# Patient Record
Sex: Female | Born: 1990 | Hispanic: No | Marital: Married | Smoking: Never smoker
Health system: Southern US, Community
[De-identification: ages and names within clinical notes are randomized; demographics above are authoritative.]

## PROBLEM LIST (undated history)

## (undated) ENCOUNTER — Inpatient Hospital Stay (HOSPITAL_COMMUNITY): Payer: Self-pay

## (undated) DIAGNOSIS — N96 Recurrent pregnancy loss: Secondary | ICD-10-CM

## (undated) DIAGNOSIS — Z789 Other specified health status: Secondary | ICD-10-CM

## (undated) HISTORY — PX: APPENDECTOMY: SHX54

## (undated) HISTORY — DX: Recurrent pregnancy loss: N96

---

## 2015-07-02 ENCOUNTER — Emergency Department (HOSPITAL_BASED_OUTPATIENT_CLINIC_OR_DEPARTMENT_OTHER): Payer: Self-pay

## 2015-07-02 ENCOUNTER — Emergency Department (HOSPITAL_BASED_OUTPATIENT_CLINIC_OR_DEPARTMENT_OTHER)
Admission: EM | Admit: 2015-07-02 | Discharge: 2015-07-02 | Disposition: A | Discharge status: Home / Self Care | Payer: Self-pay | Attending: Emergency Medicine | Admitting: Emergency Medicine

## 2015-07-02 ENCOUNTER — Encounter (HOSPITAL_BASED_OUTPATIENT_CLINIC_OR_DEPARTMENT_OTHER): Payer: Self-pay | Admitting: *Deleted

## 2015-07-02 DIAGNOSIS — N939 Abnormal uterine and vaginal bleeding, unspecified: Secondary | ICD-10-CM

## 2015-07-02 DIAGNOSIS — Z3A01 Less than 8 weeks gestation of pregnancy: Secondary | ICD-10-CM | POA: Insufficient documentation

## 2015-07-02 DIAGNOSIS — O039 Complete or unspecified spontaneous abortion without complication: Secondary | ICD-10-CM

## 2015-07-02 DIAGNOSIS — O200 Threatened abortion: Secondary | ICD-10-CM | POA: Insufficient documentation

## 2015-07-02 LAB — HCG, QUANTITATIVE, PREGNANCY: HCG, BETA CHAIN, QUANT, S: 8 m[IU]/mL — AB (ref <5)

## 2015-07-02 MED ORDER — NAPROXEN 250 MG PO TABS
ORAL_TABLET | ORAL | Status: AC
  Filled 2015-07-02: qty 2

## 2015-07-02 MED ORDER — NAPROXEN 250 MG PO TABS: 500.0000 mg | ORAL_TABLET | Freq: Once | ORAL | Status: DC

## 2015-07-02 NOTE — ED Notes (Signed)
Pt reports that she recently found out that she was pregnant.  States that she started having lower abdominal pain and bleeding today.  Changing maxi pads 2-3x per hour.

## 2015-07-02 NOTE — ED Provider Notes (Signed)
CSN: [id number]     Arrival date & time 08/01/2015  0001 History   First MD Initiated Contact with Patient Aug 01, 2015 0211     Chief Complaint  Patient presents with  . Threatened Miscarriage     (Consider location/radiation/quality/duration/timing/severity/associated sxs/prior Treatment) HPI  This is a 24 year old female who recently had a positive pregnancy test on multiple occasions. She has had 2 days of pelvic cramping and vaginal bleeding that began yesterday. The bleeding has been about as heavy as a period. Cramping has been mild. She did pass one clot or tissue mass. There are no specific mitigating or exacerbating factors. She has not taken anything for the cramping.  History reviewed. No pertinent past medical history. History reviewed. No pertinent past surgical history. History reviewed. No pertinent family history. Social History  Substance Use Topics  . Smoking status: Never Smoker   . Smokeless tobacco: None  . Alcohol Use: No   OB History    No data available     Review of Systems  All other systems reviewed and are negative.   Allergies  Review of patient's allergies indicates no known allergies.  Home Medications   Prior to Admission medications   Not on File   BP 112/67 mmHg  Pulse 83  Temp(Src) 98 F (36.7 C) (Oral)  Resp 18  Ht 5' (1.524 m)  Wt 100 lb (45.36 kg)  BMI 19.53 kg/m2  SpO2 100%  LMP 05/25/2015   Physical Exam  General: Well-developed, well-nourished female in no acute distress; appearance consistent with age of record HENT: normocephalic; atraumatic Eyes: pupils equal, round and reactive to light; extraocular muscles intact Neck: supple Heart: regular rate and rhythm Lungs: clear to auscultation bilaterally Abdomen: soft; nondistended; suprapubic tenderness; no masses or hepatosplenomegaly; bowel sounds present GU: No CVA tenderness Extremities: No deformity; full range of motion; pulses normal Neurologic: Awake, alert and  oriented; motor function intact in all extremities and symmetric; no facial droop Skin: Warm and dry Psychiatric: Normal mood and affect    ED Course  Procedures (including critical care time)   MDM   Nursing notes and vitals signs, including pulse oximetry, reviewed.  Summary of this visit's results, reviewed by myself:  Labs:  Results for orders placed or performed during the hospital encounter of 08/01/15 (from the past 24 hour(s))  hCG, quantitative, pregnancy     Status: Abnormal   Collection Time: August 01, 2015 12:14 AM  Result Value Ref Range   hCG, Beta Chain, Quant, S 8 (H) <5 mIU/mL    Imaging Studies: US Ob Comp Less 14 Wks  08/01/2015  CLINICAL DATA:  Vaginal bleeding for several hours EXAM: OBSTETRIC <14 WK [place] AND TRANSVAGINAL OB US TECHNIQUE: Both transabdominal and transvaginal ultrasound examinations were performed for complete evaluation of the gestation as well as the maternal uterus, adnexal regions, and pelvic cul-de-sac. Transvaginal technique was performed to assess early pregnancy. COMPARISON:  None. FINDINGS: Intrauterine gestational sac: Not visualized Yolk sac:  Not visualized Embryo:  Not visualized Cardiac Activity: Not visualized Maternal uterus/adnexae: Uterus measures 8.9 x 5.0 x 5.3 cm. There is no intrauterine mass. Endometrium measures 17 mm in thickness with a smooth contour. Right ovary measures 2.7 x 2.2 x 2.3 cm. Left ovary measures 3.4 x 1.5 x 1.5 cm. There is no extrauterine pelvic or adnexal mass. No free pelvic fluid. IMPRESSION: No intrauterine mass or intrauterine gestation. No extrauterine pelvic mass or free fluid. Note that no intrauterine gestational sac or fetus is seen. If serum  pregnancy test is positive, differential considerations for this circumstance include recent spontaneous abortion, early intrauterine gestation too early to be seen by either transabdominal or transvaginal technique, or possible ectopic gestation. Close clinical and  laboratory evaluation in this regard advised. Timing of potential repeat ultrasound examination will in large part depend on beta HCG values. Electronically Signed   By: Lowella Grip III M.D.   On: 07/02/2015 00:51   US Ob Transvaginal  07/02/2015  CLINICAL DATA:  Vaginal bleeding for several hours EXAM: OBSTETRIC <14 WK [place] AND TRANSVAGINAL OB US TECHNIQUE: Both transabdominal and transvaginal ultrasound examinations were performed for complete evaluation of the gestation as well as the maternal uterus, adnexal regions, and pelvic cul-de-sac. Transvaginal technique was performed to assess early pregnancy. COMPARISON:  None. FINDINGS: Intrauterine gestational sac: Not visualized Yolk sac:  Not visualized Embryo:  Not visualized Cardiac Activity: Not visualized Maternal uterus/adnexae: Uterus measures 8.9 x 5.0 x 5.3 cm. There is no intrauterine mass. Endometrium measures 17 mm in thickness with a smooth contour. Right ovary measures 2.7 x 2.2 x 2.3 cm. Left ovary measures 3.4 x 1.5 x 1.5 cm. There is no extrauterine pelvic or adnexal mass. No free pelvic fluid. IMPRESSION: No intrauterine mass or intrauterine gestation. No extrauterine pelvic mass or free fluid. Note that no intrauterine gestational sac or fetus is seen. If serum pregnancy test is positive, differential considerations for this circumstance include recent spontaneous abortion, early intrauterine gestation too early to be seen by either transabdominal or transvaginal technique, or possible ectopic gestation. Close clinical and laboratory evaluation in this regard advised. Timing of potential repeat ultrasound examination will in large part depend on beta HCG values. Electronically Signed   By: Lowella Grip III M.D.   On: 07/02/2015 00:51   Findings are consistent with miscarriage. We will have her follow-up in 48 hours at [facility] for recheck.    Shanon Rosser, MD 07/02/15 [id number]

## 2015-07-02 NOTE — ED Notes (Signed)
Patient transported to Ultrasound 

## 2015-08-15 ENCOUNTER — Inpatient Hospital Stay (HOSPITAL_COMMUNITY): Payer: Self-pay

## 2015-08-15 ENCOUNTER — Inpatient Hospital Stay (HOSPITAL_COMMUNITY)
Admission: AD | Admit: 2015-08-15 | Discharge: 2015-08-15 | Disposition: A | Discharge status: Home / Self Care | Payer: Self-pay | Source: Ambulatory Visit | Attending: Family Medicine | Admitting: Family Medicine

## 2015-08-15 ENCOUNTER — Ambulatory Visit (INDEPENDENT_AMBULATORY_CARE_PROVIDER_SITE_OTHER): Payer: Self-pay | Admitting: Student

## 2015-08-15 ENCOUNTER — Encounter (HOSPITAL_COMMUNITY): Payer: Self-pay | Admitting: *Deleted

## 2015-08-15 VITALS — BP 120/54 | HR 74 | Temp 98.1°F | Resp 18 | Wt 103.5 lb

## 2015-08-15 DIAGNOSIS — O26899 Other specified pregnancy related conditions, unspecified trimester: Secondary | ICD-10-CM

## 2015-08-15 DIAGNOSIS — O9989 Other specified diseases and conditions complicating pregnancy, childbirth and the puerperium: Secondary | ICD-10-CM

## 2015-08-15 DIAGNOSIS — Z3A01 Less than 8 weeks gestation of pregnancy: Secondary | ICD-10-CM | POA: Insufficient documentation

## 2015-08-15 DIAGNOSIS — O208 Other hemorrhage in early pregnancy: Secondary | ICD-10-CM | POA: Insufficient documentation

## 2015-08-15 DIAGNOSIS — O3680X0 Pregnancy with inconclusive fetal viability, not applicable or unspecified: Secondary | ICD-10-CM

## 2015-08-15 DIAGNOSIS — R109 Unspecified abdominal pain: Secondary | ICD-10-CM

## 2015-08-15 LAB — POCT URINALYSIS DIP (DEVICE)
Bilirubin Urine: NEGATIVE
GLUCOSE, UA: NEGATIVE mg/dL
HGB URINE DIPSTICK: NEGATIVE
Ketones, ur: NEGATIVE mg/dL
LEUKOCYTES UA: NEGATIVE
NITRITE: NEGATIVE
PROTEIN: NEGATIVE mg/dL
Specific Gravity, Urine: 1.025 (ref 1.005–1.030)
UROBILINOGEN UA: 0.2 mg/dL (ref 0.0–1.0)
pH: 5.5 (ref 5.0–8.0)

## 2015-08-15 LAB — CBC
HCT: 35 % — ABNORMAL LOW (ref 36.0–46.0)
Hemoglobin: 11.7 g/dL — ABNORMAL LOW (ref 12.0–15.0)
MCH: 26.6 pg (ref 26.0–34.0)
MCHC: 33.4 g/dL (ref 30.0–36.0)
MCV: 79.5 fL (ref 78.0–100.0)
Platelets: 233 10*3/uL (ref 150–400)
RBC: 4.4 MIL/uL (ref 3.87–5.11)
RDW: 14.3 % (ref 11.5–15.5)
WBC: 10.5 10*3/uL (ref 4.0–10.5)

## 2015-08-15 LAB — HCG, QUANTITATIVE, PREGNANCY: hCG, Beta Chain, Quant, S: 62741 m[IU]/mL — ABNORMAL HIGH (ref <5)

## 2015-08-15 LAB — ABO/RH: ABO/RH(D): O POS

## 2015-08-15 LAB — POCT PREGNANCY, URINE: Preg Test, Ur: POSITIVE — AB

## 2015-08-15 NOTE — Progress Notes (Signed)
   Subjective:    Patient ID: Mary Hays, female    DOB: 17-Apr-1991, 24 y.o.   MRN: [id number]  HPI Mary Hays is a 24 y.o. female who presents for miscarriage follow up. Was seen at ED the end of October for vaginal bleeding in pregnancy. Had previously had positive urine pregnancy tests. During that visit her BHCG was 8. Scheduled here today for follow up.  Has had unprotected intercourse since then. Positive pregnancy test in mid November x 3.  Denies vaginal bleeding but does report daily lower abdominal pain x 2 weeks that has been worse today.    Review of Systems  Constitutional: Negative.   Genitourinary: Positive for pelvic pain. Negative for dysuria and vaginal bleeding.       Objective:   Physical Exam  Constitutional: She appears well-developed and well-nourished. No distress.  HENT:  Head: Normocephalic and atraumatic.  Pulmonary/Chest: Effort normal. No respiratory distress.  Musculoskeletal: Normal range of motion.  Skin: She is not diaphoretic.  Psychiatric: She has a normal mood and affect. Her behavior is normal. Judgment and thought content normal.    BP 120/54 mmHg  Pulse 74  Temp(Src) 98.1 F (36.7 C) (Oral)  Resp 18  Wt 103 lb 8 oz (46.947 kg)  SpO2 100%  LMP  (LMP Unknown)         Assessment & Plan:   1. Abdominal pain in pregnancy    Positive UPT today in clinic.  Will send to MAU for evaluations d/t abdominal pain.   Jorje Guild, NP

## 2015-08-15 NOTE — Patient Instructions (Signed)

## 2015-08-15 NOTE — MAU Note (Signed)
Pt presents to MAU from clinic to determine how far pregnant she is. Pt had a quant a couple of months ago that was 8 and had a positive pregnancy test today in the clinic. Had lower abdominal pain this morning.

## 2015-08-15 NOTE — Discharge Instructions (Signed)

## 2015-08-15 NOTE — MAU Provider Note (Signed)
History     CSN: [id number]  Arrival date and time: 08/15/15 1604   First Provider Initiated Contact with Patient 08/15/15 1635      No chief complaint on file.  HPI  Mary Hays 24 y.o. [phone] [redacted]w[redacted]d presents from the clinic after having a follow up appointment for miscarriage. She had a positive urine pregnancy test today in the clinic. She has had some lower abdominal cramping, no vaginal bleeding  History reviewed. No pertinent past medical history.  Past Surgical History  Procedure Laterality Date  . Appendectomy      History reviewed. No pertinent family history.  Social History  Substance Use Topics  . Smoking status: Never Smoker   . Smokeless tobacco: None  . Alcohol Use: No    Allergies: No Known Allergies  No prescriptions prior to admission    Review of Systems  Constitutional: Negative for fever.  Gastrointestinal: Positive for abdominal pain.  All other systems reviewed and are negative.  Physical Exam   Blood pressure 130/61, pulse 69, temperature 98.1 F (36.7 C), resp. rate 18, last menstrual period 07/19/2015.  Physical Exam  Nursing note and vitals reviewed. Constitutional: She is oriented to person, place, and time. She appears well-developed and well-nourished. No distress.  HENT:  Head: Normocephalic and atraumatic.  Cardiovascular: Normal rate and regular rhythm.   Respiratory: Effort normal and breath sounds normal. No respiratory distress.  GI: Soft. There is no tenderness.  Musculoskeletal: Normal range of motion.  Neurological: She is alert and oriented to person, place, and time.  Skin: Skin is warm and dry.  Psychiatric: She has a normal mood and affect. Her behavior is normal. Judgment and thought content normal.   US Ob Transvaginal  08/15/2015  CLINICAL DATA:  Pelvic pain during early pregnancy. Approximately [redacted] weeks pregnant by dates. EXAM: TRANSVAGINAL OB ULTRASOUND TECHNIQUE: Transvaginal ultrasound was performed for  complete evaluation of the gestation as well as the maternal uterus, adnexal regions, and pelvic cul-de-sac. COMPARISON:  None. FINDINGS: Intrauterine gestational sac: Single intrauterine gestation identified. Yolk sac:  Present. Embryo:  Present. Cardiac Activity: Regular cardiac activity present and detected with a rate of 1 await beats per minute. Heart Rate: 108 bpm MSD:   mm    w     d CRL:   4.1  mm   6 w 1 d                  [place] EDC: 04/08/2016 Maternal uterus/adnexae: Moderate-sized subchorionic hemorrhage seen adjacent to the gestational sac. The right ovary contains the corpus luteum. No free fluid is identified in the pelvis. No evidence of adnexal masses. IMPRESSION: Single viable intrauterine gestation with estimated gestational age of [redacted] weeks and 1 day by ultrasound. Moderate subchorionic hemorrhage identified. Electronically Signed   By: Aletta Edouard M.D.   On: 08/15/2015 17:29   Results for orders placed or performed during the hospital encounter of 08/15/15 (from the past 24 hour(s))  CBC     Status: Abnormal   Collection Time: 08/15/15  4:47 PM  Result Value Ref Range   WBC 10.5 4.0 - 10.5 K/uL   RBC 4.40 3.87 - 5.11 MIL/uL   Hemoglobin 11.7 (L) 12.0 - 15.0 g/dL   HCT 35.0 (L) 36.0 - 46.0 %   MCV 79.5 78.0 - 100.0 fL   MCH 26.6 26.0 - 34.0 pg   MCHC 33.4 30.0 - 36.0 g/dL   RDW 14.3 11.5 - 15.5 %   Platelets 233  150 - 400 K/uL  ABO/Rh     Status: None (Preliminary result)   Collection Time: 08/15/15  4:47 PM  Result Value Ref Range   ABO/RH(D) O POS     MAU Course  Procedures  MDM [place]- Confirmed viable IUP; Follow up with routine Prenatal care EDD- 04/08/16 Moderate Subchorionic Hemorrhage   Assessment and Plan  Pregnancy of unknown location Abdominal Pain in Pregnancy  Start Prenatal Care Discharge   Clemmons,Lori Grissett 08/15/2015, 4:46 PM

## 2015-09-04 NOTE — L&D Delivery Note (Signed)
25 y.o. G3P1011 at [redacted]w[redacted]d delivered a viable female infant in cephalic, LOA position. No nuchal cord. Right anterior shoulder delivered with ease. 50 sec delayed cord clamping. Cord clamped x2 and cut. Placenta delivered spontaneously intact, with 3VC. Fundus firm on exam with massage and pitocin. Good hemostasis noted.  Laceration: first degree Suture: 3-0 Vicryl Good hemostasis noted.  Mom and baby recovering in LDR.    Apgars: 4/7 Cord Gas: pH 7.2 Weight: pending    Katherine Basset, DO [facility] 04/14/2016, 6:41 PM

## 2015-10-19 ENCOUNTER — Encounter: Payer: Self-pay | Admitting: Obstetrics and Gynecology

## 2015-10-19 ENCOUNTER — Ambulatory Visit (INDEPENDENT_AMBULATORY_CARE_PROVIDER_SITE_OTHER): Payer: Self-pay | Admitting: Obstetrics and Gynecology

## 2015-10-19 VITALS — BP 118/63 | HR 102 | Wt 107.6 lb

## 2015-10-19 DIAGNOSIS — Z113 Encounter for screening for infections with a predominantly sexual mode of transmission: Secondary | ICD-10-CM

## 2015-10-19 DIAGNOSIS — Z124 Encounter for screening for malignant neoplasm of cervix: Secondary | ICD-10-CM

## 2015-10-19 DIAGNOSIS — Z3493 Encounter for supervision of normal pregnancy, unspecified, third trimester: Secondary | ICD-10-CM | POA: Insufficient documentation

## 2015-10-19 DIAGNOSIS — Z3492 Encounter for supervision of normal pregnancy, unspecified, second trimester: Secondary | ICD-10-CM

## 2015-10-19 DIAGNOSIS — Z3482 Encounter for supervision of other normal pregnancy, second trimester: Secondary | ICD-10-CM

## 2015-10-19 LAB — POCT URINALYSIS DIP (DEVICE)
BILIRUBIN URINE: NEGATIVE
Glucose, UA: NEGATIVE mg/dL
HGB URINE DIPSTICK: NEGATIVE
KETONES UR: NEGATIVE mg/dL
LEUKOCYTES UA: NEGATIVE
NITRITE: NEGATIVE
Protein, ur: NEGATIVE mg/dL
Specific Gravity, Urine: 1.025 (ref 1.005–1.030)
Urobilinogen, UA: 0.2 mg/dL (ref 0.0–1.0)
pH: 6.5 (ref 5.0–8.0)

## 2015-10-19 NOTE — Progress Notes (Signed)
   Subjective:    Mary Hays is a G3P1011 [redacted]w[redacted]d being seen today for her first obstetrical visit.  Her obstetrical history is significant for uncomplicated NSVD at [facility]. Marland Kitchen Patient does intend to breast feed. Pregnancy history fully reviewed.  Patient reports no complaints.  Filed Vitals:   10/19/15 1348  BP: 118/63  Pulse: 102  Weight: 107 lb 9.6 oz (48.807 kg)    HISTORY: OB History  Gravida Para Term Preterm AB SAB TAB Ectopic Multiple Living  3 1 1  1 1    1     # Outcome Date GA Lbr Len/2nd Weight Sex Delivery Anes PTL Lv  3 Current           2 SAB 07/01/15          1 Term 03/13/13    M Vag-Spont EPI  Y     History reviewed. No pertinent past medical history. Past Surgical History  Procedure Laterality Date  . Appendectomy     Family History  Problem Relation Age of Onset  . Diabetes Father      Exam    Uterus:   S=D. DT FHR 158  Pelvic Exam: Very tense with exam   Perineum: Hemorrhoids, Normal Perineum (small external tag)   Vulva: normal, Bartholin's, Urethra, Skene's normal   Vagina:  normal mucosa, normal discharge       Cervix: no bleeding following Pap and no lesions   Adnexa: not evaluated   Bony Pelvis: average  System: Breast:  normal appearance, no masses or tenderness, Inspection negative   Skin: normal coloration and turgor, no rashes    Neurologic: oriented, normal, grossly non-focal   Extremities: normal strength, tone, and muscle mass   HEENT PERRLA   Mouth/Teeth mucous membranes moist, pharynx normal without lesions and dental hygiene good   Neck supple and no masses. Thyroid nonpalpable   Cardiovascular: regular rate and rhythm, no murmurs or gallops   Respiratory:  appears well, vitals normal, no respiratory distress, acyanotic, normal RR, ear and throat exam is normal, neck free of mass or lymphadenopathy, chest clear, no wheezing, crepitations, rhonchi, normal symmetric air entry   Abdomen: soft, non-tender; bowel sounds normal; no  masses,  no organomegaly   Urinary: urethral meatus normal      Assessment:    Pregnancy: [id number] Patient Active Problem List   Diagnosis Date Noted  . Supervision of normal pregnancy in second trimester 10/19/2015        Plan:     Initial labs drawn. 1 hr GCT due to 1st degree relative with DM Prenatal vitamins. Problem list reviewed and updated. Genetic Screening discussed Quad Screen: declined.  Ultrasound discussed; fetal survey: requested.  Follow up in 4 weeks. 50% of 30 min visit spent on counseling and coordination of care.  Anatomic [place] scheduled   Jodi Kappes 10/19/2015

## 2015-10-20 LAB — PRENATAL PROFILE (SOLSTAS)
Antibody Screen: NEGATIVE
BASOS ABS: 0 10*3/uL (ref 0.0–0.1)
BASOS PCT: 0 % (ref 0–1)
Eosinophils Absolute: 0.1 10*3/uL (ref 0.0–0.7)
Eosinophils Relative: 1 % (ref 0–5)
HEMATOCRIT: 33.4 % — AB (ref 36.0–46.0)
HEMOGLOBIN: 11.1 g/dL — AB (ref 12.0–15.0)
HEP B S AG: NEGATIVE
HIV: NONREACTIVE
LYMPHS PCT: 20 % (ref 12–46)
Lymphs Abs: 2 10*3/uL (ref 0.7–4.0)
MCH: 27.5 pg (ref 26.0–34.0)
MCHC: 33.2 g/dL (ref 30.0–36.0)
MCV: 82.7 fL (ref 78.0–100.0)
MPV: 10.6 fL (ref 8.6–12.4)
Monocytes Absolute: 0.6 10*3/uL (ref 0.1–1.0)
Monocytes Relative: 6 % (ref 3–12)
NEUTROS ABS: 7.4 10*3/uL (ref 1.7–7.7)
NEUTROS PCT: 73 % (ref 43–77)
Platelets: 195 10*3/uL (ref 150–400)
RBC: 4.04 MIL/uL (ref 3.87–5.11)
RDW: 14.8 % (ref 11.5–15.5)
RH TYPE: POSITIVE
Rubella: 4.31 Index — ABNORMAL HIGH (ref <0.90)
WBC: 10.2 10*3/uL (ref 4.0–10.5)

## 2015-10-20 LAB — CULTURE, OB URINE
COLONY COUNT: NO GROWTH
ORGANISM ID, BACTERIA: NO GROWTH

## 2015-10-20 LAB — GLUCOSE TOLERANCE, 1 HOUR (50G) W/O FASTING: GLUCOSE 1 HOUR GTT: 107 mg/dL (ref 70–140)

## 2015-10-20 LAB — GC/CHLAMYDIA PROBE AMP (~~LOC~~) NOT AT ARMC
Chlamydia: NEGATIVE
Neisseria Gonorrhea: NEGATIVE

## 2015-10-20 LAB — CYTOLOGY - PAP

## 2015-10-21 LAB — PRESCRIPTION MONITORING PROFILE (19 PANEL)
Amphetamine/Meth: NEGATIVE ng/mL
BARBITURATE SCREEN, URINE: NEGATIVE ng/mL
Benzodiazepine Screen, Urine: NEGATIVE ng/mL
Buprenorphine, Urine: NEGATIVE ng/mL
CANNABINOID SCRN UR: NEGATIVE ng/mL
CARISOPRODOL, URINE: NEGATIVE ng/mL
COCAINE METABOLITES: NEGATIVE ng/mL
CREATININE, URINE: 152.53 mg/dL (ref >20.0)
ECSTASY: NEGATIVE ng/mL
FENTANYL URINE: NEGATIVE ng/mL
MEPERIDINE UR: NEGATIVE ng/mL
METHADONE SCREEN, URINE: NEGATIVE ng/mL
Methaqualone: NEGATIVE ng/mL
Nitrites, Initial: NEGATIVE ug/mL
OPIATE SCREEN, URINE: NEGATIVE ng/mL
OXYCODONE SCRN UR: NEGATIVE ng/mL
PH URINE, INITIAL: 6.7 pH (ref 4.5–8.9)
PHENCYCLIDINE, UR: NEGATIVE ng/mL
Propoxyphene: NEGATIVE ng/mL
Tapentadol, urine: NEGATIVE ng/mL
Tramadol Scrn, Ur: NEGATIVE ng/mL
Zolpidem, Urine: NEGATIVE ng/mL

## 2015-10-21 LAB — HEMOGLOBINOPATHY EVALUATION
HEMOGLOBIN OTHER: 0 %
HGB F QUANT: 0.4 % (ref 0.0–2.0)
Hgb A2 Quant: 2.9 % (ref 2.2–3.2)
Hgb A: 96.7 % — ABNORMAL LOW (ref 96.8–97.8)
Hgb S Quant: 0 %

## 2015-11-09 ENCOUNTER — Other Ambulatory Visit: Payer: Self-pay | Admitting: Obstetrics and Gynecology

## 2015-11-09 ENCOUNTER — Ambulatory Visit (HOSPITAL_COMMUNITY)
Admission: RE | Admit: 2015-11-09 | Discharge: 2015-11-09 | Disposition: A | Discharge status: Home / Self Care | Payer: Self-pay | Source: Ambulatory Visit | Attending: Obstetrics and Gynecology | Admitting: Obstetrics and Gynecology

## 2015-11-09 DIAGNOSIS — Z3A18 18 weeks gestation of pregnancy: Secondary | ICD-10-CM | POA: Insufficient documentation

## 2015-11-09 DIAGNOSIS — Z36 Encounter for antenatal screening of mother: Secondary | ICD-10-CM | POA: Insufficient documentation

## 2015-11-09 DIAGNOSIS — Z3689 Encounter for other specified antenatal screening: Secondary | ICD-10-CM

## 2015-11-09 DIAGNOSIS — Z3492 Encounter for supervision of normal pregnancy, unspecified, second trimester: Secondary | ICD-10-CM

## 2015-11-16 ENCOUNTER — Ambulatory Visit (INDEPENDENT_AMBULATORY_CARE_PROVIDER_SITE_OTHER): Payer: Self-pay | Admitting: Family

## 2015-11-16 VITALS — BP 113/58 | HR 78 | Temp 98.3°F | Wt 111.9 lb

## 2015-11-16 DIAGNOSIS — Z3492 Encounter for supervision of normal pregnancy, unspecified, second trimester: Secondary | ICD-10-CM

## 2015-11-16 DIAGNOSIS — Z3482 Encounter for supervision of other normal pregnancy, second trimester: Secondary | ICD-10-CM

## 2015-11-16 LAB — POCT URINALYSIS DIP (DEVICE)
Bilirubin Urine: NEGATIVE
GLUCOSE, UA: NEGATIVE mg/dL
Hgb urine dipstick: NEGATIVE
Ketones, ur: NEGATIVE mg/dL
NITRITE: NEGATIVE
PROTEIN: NEGATIVE mg/dL
Specific Gravity, Urine: 1.025 (ref 1.005–1.030)
UROBILINOGEN UA: 0.2 mg/dL (ref 0.0–1.0)
pH: 6 (ref 5.0–8.0)

## 2015-11-16 NOTE — Progress Notes (Signed)
Breastfeeding tip of the week reviewed. 

## 2015-11-16 NOTE — Progress Notes (Signed)
Subjective:  Mary Hays is a 25 y.o. G3P1011 at [redacted]w[redacted]d being seen today for ongoing prenatal care.  She is currently monitored for the following issues for this low-risk pregnancy and has Supervision of normal pregnancy in second trimester on her problem list.  Patient reports no complaints.  Contractions: Not present. Vag. Bleeding: None.  Movement: Present. Denies leaking of fluid.   The following portions of the patient's history were reviewed and updated as appropriate: allergies, current medications, past family history, past medical history, past social history, past surgical history and problem list. Problem list updated.  Objective:   Filed Vitals:   11/16/15 1202  BP: 113/58  Pulse: 78  Temp: 98.3 F (36.8 C)  Weight: 111 lb 14.4 oz (50.758 kg)    Fetal Status: Fetal Heart Rate (bpm): 146 Fundal Height: 19 cm Movement: Present     General:  Alert, oriented and cooperative. Patient is in no acute distress.  Skin: Skin is warm and dry. No rash noted.   Cardiovascular: Normal heart rate noted  Respiratory: Normal respiratory effort, no problems with respiration noted  Abdomen: Soft, gravid, appropriate for gestational age. Pain/Pressure: Absent     Pelvic: Vag. Bleeding: None     Cervical exam deferred        Extremities: Normal range of motion.  Edema: None  Mental Status: Normal mood and affect. Normal behavior. Normal judgment and thought content.   Urinalysis: Urine Protein: Negative Urine Glucose: Negative  Assessment and Plan:  Pregnancy: G3P1011 at [redacted]w[redacted]d  1. Supervision of normal pregnancy in second trimester - Reviewed ultrasound results  General obstetric precautions including but not limited to vaginal bleeding and pelvic pain reviewed in detail with the patient. Please refer to After Visit Summary for other counseling recommendations.  Return in about 4 weeks (around 12/14/2015).   Venia Carbon Michiel Cowboy, CNM

## 2015-11-22 NOTE — Addendum Note (Signed)
Encounter addended by: Lorene Dy, CNM on: 11/22/2015  5:13 PM<BR>     Documentation filed: Problem List, Message

## 2015-12-14 ENCOUNTER — Encounter: Payer: Self-pay | Admitting: Advanced Practice Midwife

## 2016-02-29 ENCOUNTER — Ambulatory Visit (INDEPENDENT_AMBULATORY_CARE_PROVIDER_SITE_OTHER): Payer: Self-pay | Admitting: Student

## 2016-02-29 VITALS — BP 115/69 | HR 72 | Wt 134.9 lb

## 2016-02-29 DIAGNOSIS — Z3483 Encounter for supervision of other normal pregnancy, third trimester: Secondary | ICD-10-CM

## 2016-02-29 DIAGNOSIS — Z3492 Encounter for supervision of normal pregnancy, unspecified, second trimester: Secondary | ICD-10-CM

## 2016-02-29 LAB — POCT URINALYSIS DIP (DEVICE)
Bilirubin Urine: NEGATIVE
Glucose, UA: NEGATIVE mg/dL
HGB URINE DIPSTICK: NEGATIVE
KETONES UR: NEGATIVE mg/dL
Nitrite: NEGATIVE
PH: 6 (ref 5.0–8.0)
PROTEIN: 30 mg/dL — AB
SPECIFIC GRAVITY, URINE: 1.025 (ref 1.005–1.030)
UROBILINOGEN UA: 0.2 mg/dL (ref 0.0–1.0)

## 2016-02-29 NOTE — Patient Instructions (Signed)

## 2016-02-29 NOTE — Progress Notes (Signed)
Subjective:  Mary Hays is a 25 y.o. G3P1011 at [redacted]w[redacted]d being seen today for ongoing prenatal care.  She is currently monitored for the following issues for this low-risk pregnancy and has Supervision of normal pregnancy in second trimester on her problem list.  Patient reports no complaints.  Contractions: Not present. Vag. Bleeding: None.  Movement: Present. Denies leaking of fluid. Pt missed last appointment & hasn't been seen since March. States she missed appts d/t death in the family (her nephew). Can't stay today for glucola but will schedule for lab only visit.  Reports some lower abdominal pressure x 2 weeks. No contractions, LOF, or vaginal bleeding.   The following portions of the patient's history were reviewed and updated as appropriate: allergies, current medications, past family history, past medical history, past social history, past surgical history and problem list. Problem list updated.  Objective:   Filed Vitals:   02/29/16 0924  BP: 115/69  Pulse: 72  Weight: 134 lb 14.4 oz (61.19 kg)    Fetal Status: Fetal Heart Rate (bpm): 136   Movement: Present     General:  Alert, oriented and cooperative. Patient is in no acute distress.  Skin: Skin is warm and dry. No rash noted.   Cardiovascular: Normal heart rate noted  Respiratory: Normal respiratory effort, no problems with respiration noted  Abdomen: Soft, gravid, appropriate for gestational age. Pain/Pressure: Present    Fundal height 33 cm  Pelvic: Cervical exam deferred        Extremities: Normal range of motion.     Mental Status: Normal mood and affect. Normal behavior. Normal judgment and thought content.   Urinalysis:      Assessment and Plan:  Pregnancy: G3P1011 at [redacted]w[redacted]d  1. Supervision of normal pregnancy in second trimester -Pt will schedule lab only visit for 28 wks labs -recommended maternity support belt for pressure  Preterm labor symptoms and general obstetric precautions including but not limited  to vaginal bleeding, contractions, leaking of fluid and fetal movement were reviewed in detail with the patient. Please refer to After Visit Summary for other counseling recommendations.  Return in about 2 weeks (around 03/14/2016) for Routine OB.   Jorje Guild, NP

## 2016-03-08 ENCOUNTER — Other Ambulatory Visit: Payer: Self-pay

## 2016-03-08 DIAGNOSIS — Z3492 Encounter for supervision of normal pregnancy, unspecified, second trimester: Secondary | ICD-10-CM

## 2016-03-08 LAB — CBC
HEMATOCRIT: 27.3 % — AB (ref 35.0–45.0)
Hemoglobin: 8.5 g/dL — ABNORMAL LOW (ref 11.7–15.5)
MCH: 21.9 pg — AB (ref 27.0–33.0)
MCHC: 31.1 g/dL — AB (ref 32.0–36.0)
MCV: 70.4 fL — AB (ref 80.0–100.0)
MPV: 10.7 fL (ref 7.5–12.5)
PLATELETS: 221 10*3/uL (ref 140–400)
RBC: 3.88 MIL/uL (ref 3.80–5.10)
RDW: 15.9 % — AB (ref 11.0–15.0)
WBC: 10.4 10*3/uL (ref 3.8–10.8)

## 2016-03-09 ENCOUNTER — Other Ambulatory Visit: Payer: Self-pay | Admitting: Student

## 2016-03-09 DIAGNOSIS — O99013 Anemia complicating pregnancy, third trimester: Secondary | ICD-10-CM

## 2016-03-09 LAB — GLUCOSE TOLERANCE, 1 HOUR (50G) W/O FASTING: GLUCOSE, 1 HR, GESTATIONAL: 119 mg/dL (ref <140)

## 2016-03-09 LAB — HIV ANTIBODY (ROUTINE TESTING W REFLEX): HIV: NONREACTIVE

## 2016-03-09 LAB — RPR

## 2016-03-09 MED ORDER — FERROUS SULFATE 325 (65 FE) MG PO TABS: 325.0000 mg | ORAL_TABLET | Freq: Two times a day (BID) | ORAL | Status: DC

## 2016-03-13 ENCOUNTER — Telehealth: Payer: Self-pay | Admitting: General Practice

## 2016-03-13 NOTE — Telephone Encounter (Signed)
Pt informed of results.

## 2016-03-13 NOTE — Telephone Encounter (Signed)
Per Jorje Guild, patient's iron level is low and iron Rx has been sent to her pharmacy. Called patient, no answer- left message stating we are trying to reach you with non urgent results, please call us back

## 2016-03-22 ENCOUNTER — Ambulatory Visit (INDEPENDENT_AMBULATORY_CARE_PROVIDER_SITE_OTHER): Payer: Self-pay | Admitting: Student

## 2016-03-22 VITALS — BP 116/79 | HR 84 | Wt 139.0 lb

## 2016-03-22 DIAGNOSIS — Z3493 Encounter for supervision of normal pregnancy, unspecified, third trimester: Secondary | ICD-10-CM

## 2016-03-22 DIAGNOSIS — Z3483 Encounter for supervision of other normal pregnancy, third trimester: Secondary | ICD-10-CM

## 2016-03-22 DIAGNOSIS — Z113 Encounter for screening for infections with a predominantly sexual mode of transmission: Secondary | ICD-10-CM

## 2016-03-22 LAB — POCT URINALYSIS DIP (DEVICE)
BILIRUBIN URINE: NEGATIVE
Glucose, UA: NEGATIVE mg/dL
KETONES UR: NEGATIVE mg/dL
Nitrite: NEGATIVE
PH: 6.5 (ref 5.0–8.0)
Protein, ur: NEGATIVE mg/dL
Specific Gravity, Urine: 1.02 (ref 1.005–1.030)
Urobilinogen, UA: 0.2 mg/dL (ref 0.0–1.0)

## 2016-03-22 NOTE — Patient Instructions (Signed)
Braxton Hicks Contractions °Contractions of the uterus can occur throughout pregnancy. Contractions are not always a sign that you are in labor.  °WHAT ARE BRAXTON HICKS CONTRACTIONS?  °Contractions that occur before labor are called Braxton Hicks contractions, or false labor. Toward the end of pregnancy (32-34 weeks), these contractions can develop more often and may become more forceful. This is not true labor because these contractions do not result in opening (dilatation) and thinning of the cervix. They are sometimes difficult to tell apart from true labor because these contractions can be forceful and people have different pain tolerances. You should not feel embarrassed if you go to the hospital with false labor. Sometimes, the only way to tell if you are in true labor is for your health care provider to look for changes in the cervix. °If there are no prenatal problems or other health problems associated with the pregnancy, it is completely safe to be sent home with false labor and await the onset of true labor. °HOW CAN YOU TELL THE DIFFERENCE BETWEEN TRUE AND FALSE LABOR? °False Labor °· The contractions of false labor are usually shorter and not as hard as those of true labor.   °· The contractions are usually irregular.   °· The contractions are often felt in the front of the lower abdomen and in the groin.   °· The contractions may go away when you walk around or change positions while lying down.   °· The contractions get weaker and are shorter lasting as time goes on.   °· The contractions do not usually become progressively stronger, regular, and closer together as with true labor.   °True Labor °· Contractions in true labor last 30-70 seconds, become very regular, usually become more intense, and increase in frequency.   °· The contractions do not go away with walking.   °· The discomfort is usually felt in the top of the uterus and spreads to the lower abdomen and low back.   °· True labor can be  determined by your health care provider with an exam. This will show that the cervix is dilating and getting thinner.   °WHAT TO REMEMBER °· Keep up with your usual exercises and follow other instructions given by your health care provider.   °· Take medicines as directed by your health care provider.   °· Keep your regular prenatal appointments.   °· Eat and drink lightly if you think you are going into labor.   °· If Braxton Hicks contractions are making you uncomfortable:   °¨ Change your position from lying down or resting to walking, or from walking to resting.   °¨ Sit and rest in a tub of warm water.   °¨ Drink 2-3 glasses of water. Dehydration may cause these contractions.   °¨ Do slow and deep breathing several times an hour.   °WHEN SHOULD I SEEK IMMEDIATE MEDICAL CARE? °Seek immediate medical care if: °· Your contractions become stronger, more regular, and closer together.   °· You have fluid leaking or gushing from your vagina.   °· You have a fever.   °· You pass blood-tinged mucus.   °· You have vaginal bleeding.   °· You have continuous abdominal pain.   °· You have low back pain that you never had before.   °· You feel your baby's head pushing down and causing pelvic pressure.   °· Your baby is not moving as much as it used to.   °  °This information is not intended to replace advice given to you by your health care provider. Make sure you discuss any questions you have with your health care   provider. °  °Document Released: 08/20/2005 Document Revised: 08/25/2013 Document Reviewed: 06/01/2013 °Elsevier Interactive Patient Education ©2016 [employer] ° °

## 2016-03-23 LAB — GC/CHLAMYDIA PROBE AMP (~~LOC~~) NOT AT ARMC
Chlamydia: NEGATIVE
NEISSERIA GONORRHEA: NEGATIVE

## 2016-03-23 NOTE — Progress Notes (Signed)
Subjective:  Mary Hays is a 25 y.o. G3P1011 at 110w5d being seen today for ongoing prenatal care.  She is currently monitored for the following issues for this low-risk pregnancy: has Supervision of normal pregnancy in second trimester on her problem list.  Patient reports occasional contractions.  Denies vaginal bleeding or LOF. Positive fetal movement.   The following portions of the patient's history were reviewed and updated as appropriate: allergies, current medications, past family history, past medical history, past social history, past surgical history and problem list. Problem list updated.  Objective:   Filed Vitals:   03/22/16 0952  BP: 116/79  Pulse: 84  Weight: 139 lb (63.05 kg)     Fetal Status: FHT 131  General:  Alert, oriented and cooperative. Patient is in no acute distress.  Skin: Skin is warm and dry. No rash noted.   Cardiovascular: Normal heart rate noted  Respiratory: Normal respiratory effort, no problems with respiration noted  Abdomen: Soft, gravid, appropriate for gestational age. Fundal height 39cm     Pelvic:  Cervical exam performed  Dilation: 1 Effacement (%): Thick Station: -3 Presentation: Vertex   Extremities: Normal range of motion.    Mental Status: Normal mood and affect. Normal behavior. Normal judgment and thought content.   Urinalysis:  Negative protein & glucose  Assessment and Plan:  Pregnancy: G3P1011 at [redacted]w[redacted]d   1. Supervision of normal pregnancy in third trimester  - Culture, beta strep (group b only) - GC/Chlamydia probe amp ([place])not at [facility]  Term labor symptoms and general obstetric precautions including but not limited to vaginal bleeding, contractions, leaking of fluid and fetal movement were reviewed in detail with the patient. Please refer to After Visit Summary for other counseling recommendations.  Return in about 1 week (around 03/29/2016) for Routine OB.   Jorje Guild, NP

## 2016-03-24 LAB — CULTURE, BETA STREP (GROUP B ONLY)

## 2016-03-29 ENCOUNTER — Encounter: Payer: Self-pay | Admitting: Obstetrics & Gynecology

## 2016-03-29 ENCOUNTER — Ambulatory Visit (INDEPENDENT_AMBULATORY_CARE_PROVIDER_SITE_OTHER): Payer: Self-pay | Admitting: Obstetrics & Gynecology

## 2016-03-29 VITALS — BP 107/69 | HR 70 | Wt 140.9 lb

## 2016-03-29 DIAGNOSIS — Z3493 Encounter for supervision of normal pregnancy, unspecified, third trimester: Secondary | ICD-10-CM

## 2016-03-29 LAB — POCT URINALYSIS DIP (DEVICE)
Bilirubin Urine: NEGATIVE
Bilirubin Urine: NEGATIVE
GLUCOSE, UA: NEGATIVE mg/dL
GLUCOSE, UA: NEGATIVE mg/dL
HGB URINE DIPSTICK: NEGATIVE
Hgb urine dipstick: NEGATIVE
Ketones, ur: NEGATIVE mg/dL
Ketones, ur: NEGATIVE mg/dL
NITRITE: NEGATIVE
NITRITE: NEGATIVE
PROTEIN: 30 mg/dL — AB
Protein, ur: NEGATIVE mg/dL
Specific Gravity, Urine: 1.025 (ref 1.005–1.030)
Specific Gravity, Urine: 1.025 (ref 1.005–1.030)
UROBILINOGEN UA: 0.2 mg/dL (ref 0.0–1.0)
UROBILINOGEN UA: 0.2 mg/dL (ref 0.0–1.0)
pH: 6.5 (ref 5.0–8.0)
pH: 5.5 (ref 5.0–8.0)

## 2016-03-29 NOTE — Patient Instructions (Signed)

## 2016-03-29 NOTE — Progress Notes (Signed)
Patient ID: Mary Hays, female   DOB: 27-Dec-1990, 25 y.o.   MRN: [id number] Subjective:  Shia Lhotka is a 25 y.o. G3P1011 at [redacted]w[redacted]d being seen today for ongoing prenatal care.  She is currently monitored for the following issues for this low-risk pregnancy and has Supervision of normal pregnancy in third trimester on her problem list.  Patient reports no complaints.  Contractions: Irregular. Vag. Bleeding: None.  Movement: Present. Denies leaking of fluid.   The following portions of the patient's history were reviewed and updated as appropriate: allergies, current medications, past family history, past medical history, past social history, past surgical history and problem list. Problem list updated.  Objective:   Vitals:   03/29/16 1348  BP: 107/69  Pulse: 70  Weight: 140 lb 14.4 oz (63.9 kg)    Fetal Status: Fetal Heart Rate (bpm): 140   Movement: Present     General:  Alert, oriented and cooperative. Patient is in no acute distress.  Skin: Skin is warm and dry. No rash noted.   Cardiovascular: Normal heart rate noted  Respiratory: Normal respiratory effort, no problems with respiration noted  Abdomen: Soft, gravid, appropriate for gestational age. Pain/Pressure: Present     Pelvic:  Cervical exam deferred        Extremities: Normal range of motion.  Edema: None  Mental Status: Normal mood and affect. Normal behavior. Normal judgment and thought content.   Urinalysis:      Assessment and Plan:  Pregnancy: G3P1011 at [redacted]w[redacted]d  There are no diagnoses linked to this encounter. Term labor symptoms and general obstetric precautions including but not limited to vaginal bleeding, contractions, leaking of fluid and fetal movement were reviewed in detail with the patient. Please refer to After Visit Summary for other counseling recommendations.  No Follow-up on file. 1 week f/u, labor precaution   Woodroe Mode, MD

## 2016-04-06 ENCOUNTER — Encounter (HOSPITAL_COMMUNITY): Payer: Self-pay

## 2016-04-06 ENCOUNTER — Inpatient Hospital Stay (HOSPITAL_COMMUNITY)
Admission: AD | Admit: 2016-04-06 | Discharge: 2016-04-06 | Disposition: A | Discharge status: Home / Self Care | Payer: Medicaid Other | Source: Ambulatory Visit | Attending: Family Medicine | Admitting: Family Medicine

## 2016-04-06 DIAGNOSIS — Z3490 Encounter for supervision of normal pregnancy, unspecified, unspecified trimester: Secondary | ICD-10-CM | POA: Diagnosis not present

## 2016-04-06 DIAGNOSIS — Z3493 Encounter for supervision of normal pregnancy, unspecified, third trimester: Secondary | ICD-10-CM

## 2016-04-06 NOTE — Progress Notes (Signed)
   04/06/16 0319  Provider Notification  Provider Name/Title Nigel Berthold CNM  Method of Notification Phone  Ms Luquin here to r/o labor. G3 P1 at 39wks and 5day Cat 1 strip,  SVE 1/thk/-3. Had 2uc. Order received by provider to dc pt home

## 2016-04-06 NOTE — Discharge Instructions (Signed)
Braxton Hicks Contractions °Contractions of the uterus can occur throughout pregnancy. Contractions are not always a sign that you are in labor.  °WHAT ARE BRAXTON HICKS CONTRACTIONS?  °Contractions that occur before labor are called Braxton Hicks contractions, or false labor. Toward the end of pregnancy (32-34 weeks), these contractions can develop more often and may become more forceful. This is not true labor because these contractions do not result in opening (dilatation) and thinning of the cervix. They are sometimes difficult to tell apart from true labor because these contractions can be forceful and people have different pain tolerances. You should not feel embarrassed if you go to the hospital with false labor. Sometimes, the only way to tell if you are in true labor is for your health care provider to look for changes in the cervix. °If there are no prenatal problems or other health problems associated with the pregnancy, it is completely safe to be sent home with false labor and await the onset of true labor. °HOW CAN YOU TELL THE DIFFERENCE BETWEEN TRUE AND FALSE LABOR? °False Labor °· The contractions of false labor are usually shorter and not as hard as those of true labor.   °· The contractions are usually irregular.   °· The contractions are often felt in the front of the lower abdomen and in the groin.   °· The contractions may go away when you walk around or change positions while lying down.   °· The contractions get weaker and are shorter lasting as time goes on.   °· The contractions do not usually become progressively stronger, regular, and closer together as with true labor.   °True Labor °· Contractions in true labor last 30-70 seconds, become very regular, usually become more intense, and increase in frequency.   °· The contractions do not go away with walking.   °· The discomfort is usually felt in the top of the uterus and spreads to the lower abdomen and low back.   °· True labor can be  determined by your health care provider with an exam. This will show that the cervix is dilating and getting thinner.   °WHAT TO REMEMBER °· Keep up with your usual exercises and follow other instructions given by your health care provider.   °· Take medicines as directed by your health care provider.   °· Keep your regular prenatal appointments.   °· Eat and drink lightly if you think you are going into labor.   °· If Braxton Hicks contractions are making you uncomfortable:   °¨ Change your position from lying down or resting to walking, or from walking to resting.   °¨ Sit and rest in a tub of warm water.   °¨ Drink 2-3 glasses of water. Dehydration may cause these contractions.   °¨ Do slow and deep breathing several times an hour.   °WHEN SHOULD I SEEK IMMEDIATE MEDICAL CARE? °Seek immediate medical care if: °· Your contractions become stronger, more regular, and closer together.   °· You have fluid leaking or gushing from your vagina.   °· You have a fever.   °· You pass blood-tinged mucus.   °· You have vaginal bleeding.   °· You have continuous abdominal pain.   °· You have low back pain that you never had before.   °· You feel your baby's head pushing down and causing pelvic pressure.   °· Your baby is not moving as much as it used to.   °  °This information is not intended to replace advice given to you by your health care provider. Make sure you discuss any questions you have with your health care   provider. °  °Document Released: 08/20/2005 Document Revised: 08/25/2013 Document Reviewed: 06/01/2013 °Elsevier Interactive Patient Education ©2016 Else[employer] ° °

## 2016-04-06 NOTE — MAU Note (Signed)
PT SAYS SHE  FELT  UC    STRONG  AT 1030PM.     PNC  IN [facility]-   VE   2 WEEKS  AGO      1   CM.    DENIES HSV  AND  MRSA.      GBS- NEG

## 2016-04-06 NOTE — OB Triage Note (Signed)
Contractions since 8/3 0500, got stronger at 2230

## 2016-04-09 ENCOUNTER — Encounter (HOSPITAL_COMMUNITY): Payer: Self-pay | Admitting: *Deleted

## 2016-04-09 ENCOUNTER — Inpatient Hospital Stay (HOSPITAL_COMMUNITY)
Admission: AD | Admit: 2016-04-09 | Discharge: 2016-04-09 | Disposition: A | Discharge status: Home / Self Care | Payer: Medicaid Other | Source: Ambulatory Visit | Attending: Obstetrics & Gynecology | Admitting: Obstetrics & Gynecology

## 2016-04-09 DIAGNOSIS — O471 False labor at or after 37 completed weeks of gestation: Secondary | ICD-10-CM | POA: Insufficient documentation

## 2016-04-09 DIAGNOSIS — Z3A41 41 weeks gestation of pregnancy: Secondary | ICD-10-CM | POA: Insufficient documentation

## 2016-04-09 DIAGNOSIS — Z3493 Encounter for supervision of normal pregnancy, unspecified, third trimester: Secondary | ICD-10-CM

## 2016-04-09 HISTORY — DX: Other specified health status: Z78.9

## 2016-04-09 NOTE — OB Triage Note (Signed)
Patient has occasional contraction, reactive FHR tracing and no cervical change.  Dr. Lindell Noe made aware, orders to discharge home.  Patient to call and follow up with clinic.  Discharge instructions given including when to return for labor, decreased fetal movement or leaking of fluid or blood.  Pt verbalized understanding.

## 2016-04-09 NOTE — Discharge Instructions (Signed)
Braxton Hicks Contractions °Contractions of the uterus can occur throughout pregnancy. Contractions are not always a sign that you are in labor.  °WHAT ARE BRAXTON HICKS CONTRACTIONS?  °Contractions that occur before labor are called Braxton Hicks contractions, or false labor. Toward the end of pregnancy (32-34 weeks), these contractions can develop more often and may become more forceful. This is not true labor because these contractions do not result in opening (dilatation) and thinning of the cervix. They are sometimes difficult to tell apart from true labor because these contractions can be forceful and people have different pain tolerances. You should not feel embarrassed if you go to the hospital with false labor. Sometimes, the only way to tell if you are in true labor is for your health care provider to look for changes in the cervix. °If there are no prenatal problems or other health problems associated with the pregnancy, it is completely safe to be sent home with false labor and await the onset of true labor. °HOW CAN YOU TELL THE DIFFERENCE BETWEEN TRUE AND FALSE LABOR? °False Labor °· The contractions of false labor are usually shorter and not as hard as those of true labor.   °· The contractions are usually irregular.   °· The contractions are often felt in the front of the lower abdomen and in the groin.   °· The contractions may go away when you walk around or change positions while lying down.   °· The contractions get weaker and are shorter lasting as time goes on.   °· The contractions do not usually become progressively stronger, regular, and closer together as with true labor.   °True Labor °· Contractions in true labor last 30-70 seconds, become very regular, usually become more intense, and increase in frequency.   °· The contractions do not go away with walking.   °· The discomfort is usually felt in the top of the uterus and spreads to the lower abdomen and low back.   °· True labor can be  determined by your health care provider with an exam. This will show that the cervix is dilating and getting thinner.   °WHAT TO REMEMBER °· Keep up with your usual exercises and follow other instructions given by your health care provider.   °· Take medicines as directed by your health care provider.   °· Keep your regular prenatal appointments.   °· Eat and drink lightly if you think you are going into labor.   °· If Braxton Hicks contractions are making you uncomfortable:   °¨ Change your position from lying down or resting to walking, or from walking to resting.   °¨ Sit and rest in a tub of warm water.   °¨ Drink 2-3 glasses of water. Dehydration may cause these contractions.   °¨ Do slow and deep breathing several times an hour.   °WHEN SHOULD I SEEK IMMEDIATE MEDICAL CARE? °Seek immediate medical care if: °· Your contractions become stronger, more regular, and closer together.   °· You have fluid leaking or gushing from your vagina.   °· You have a fever.   °· You pass blood-tinged mucus.   °· You have vaginal bleeding.   °· You have continuous abdominal pain.   °· You have low back pain that you never had before.   °· You feel your baby's head pushing down and causing pelvic pressure.   °· Your baby is not moving as much as it used to.   °  °This information is not intended to replace advice given to you by your health care provider. Make sure you discuss any questions you have with your health care   provider. °  °Document Released: 08/20/2005 Document Revised: 08/25/2013 Document Reviewed: 06/01/2013 °Elsevier Interactive Patient Education ©2016 Else[employer] ° °

## 2016-04-11 ENCOUNTER — Ambulatory Visit (INDEPENDENT_AMBULATORY_CARE_PROVIDER_SITE_OTHER): Payer: Medicaid Other | Admitting: Family

## 2016-04-11 VITALS — BP 113/66 | HR 82 | Wt 141.3 lb

## 2016-04-11 DIAGNOSIS — O480 Post-term pregnancy: Secondary | ICD-10-CM | POA: Diagnosis present

## 2016-04-11 DIAGNOSIS — Z36 Encounter for antenatal screening of mother: Secondary | ICD-10-CM

## 2016-04-11 DIAGNOSIS — Z3493 Encounter for supervision of normal pregnancy, unspecified, third trimester: Secondary | ICD-10-CM

## 2016-04-11 LAB — POCT URINALYSIS DIP (DEVICE)
Bilirubin Urine: NEGATIVE
GLUCOSE, UA: NEGATIVE mg/dL
Hgb urine dipstick: NEGATIVE
Ketones, ur: NEGATIVE mg/dL
NITRITE: NEGATIVE
PH: 6.5 (ref 5.0–8.0)
PROTEIN: 30 mg/dL — AB
Specific Gravity, Urine: 1.025 (ref 1.005–1.030)
UROBILINOGEN UA: 0.2 mg/dL (ref 0.0–1.0)

## 2016-04-11 NOTE — Progress Notes (Signed)
Scheduled for IOL 04/14/16 at 0700.

## 2016-04-11 NOTE — Progress Notes (Signed)
Subjective:  Mary Hays is a 25 y.o. G3P1011 at [redacted]w[redacted]d being seen today for ongoing prenatal care.  She is currently monitored for the following issues for this low-risk pregnancy and has Supervision of normal pregnancy in third trimester on her problem list.  Patient reports occasional contractions.  Contractions: Irregular. Vag. Bleeding: None.  Movement: Present. Denies leaking of fluid.   The following portions of the patient's history were reviewed and updated as appropriate: allergies, current medications, past family history, past medical history, past social history, past surgical history and problem list. Problem list updated.  Objective:   Vitals:   04/11/16 1120  BP: 113/66  Pulse: 82  Weight: 141 lb 4.8 oz (64.1 kg)    Fetal Status: Fetal Heart Rate (bpm): NST-R Fundal Height: 38 cm Movement: Present  Presentation: Vertex  General:  Alert, oriented and cooperative. Patient is in no acute distress.  Skin: Skin is warm and dry. No rash noted.   Cardiovascular: Normal heart rate noted  Respiratory: Normal respiratory effort, no problems with respiration noted  Abdomen: Soft, gravid, appropriate for gestational age. Pain/Pressure: Present     Pelvic:  Cervical exam performed Dilation: 1 Effacement (%): Thick Station: -3  Extremities: Normal range of motion.  Edema: None  Mental Status: Normal mood and affect. Normal behavior. Normal judgment and thought content.   Urinalysis: Urine Protein: 1+ Urine Glucose: Negative  Assessment and Plan:  Pregnancy: G3P1011 at [redacted]w[redacted]d  1. Post term pregnancy, antepartum condition or complication - Amniotic fluid index with NST > reactive - IOL scheduled for 8/12  2. Supervision of normal pregnancy in third trimester - Discussed IOL process  Term labor symptoms and general obstetric precautions including but not limited to vaginal bleeding, contractions, leaking of fluid and fetal movement were reviewed in detail with the  patient. Please refer to After Visit Summary for other counseling recommendations.  Return in about 6 weeks (around 05/23/2016) for PP visit.   Venia Carbon Michiel Cowboy, CNM

## 2016-04-11 NOTE — Progress Notes (Signed)
Pt seen @ MAU 2 days ago for R/o labor.

## 2016-04-13 ENCOUNTER — Other Ambulatory Visit: Payer: Self-pay | Admitting: Advanced Practice Midwife

## 2016-04-14 ENCOUNTER — Encounter (HOSPITAL_COMMUNITY): Payer: Self-pay

## 2016-04-14 ENCOUNTER — Other Ambulatory Visit: Payer: Self-pay | Admitting: Advanced Practice Midwife

## 2016-04-14 ENCOUNTER — Inpatient Hospital Stay (HOSPITAL_COMMUNITY)
Admission: RE | Admit: 2016-04-14 | Discharge: 2016-04-15 | DRG: 775 | Disposition: A | Discharge status: Home / Self Care | Payer: Medicaid Other | Source: Ambulatory Visit | Attending: Obstetrics & Gynecology | Admitting: Obstetrics & Gynecology

## 2016-04-14 DIAGNOSIS — O480 Post-term pregnancy: Principal | ICD-10-CM | POA: Diagnosis present

## 2016-04-14 DIAGNOSIS — Z3A40 40 weeks gestation of pregnancy: Secondary | ICD-10-CM

## 2016-04-14 DIAGNOSIS — Z3493 Encounter for supervision of normal pregnancy, unspecified, third trimester: Secondary | ICD-10-CM

## 2016-04-14 DIAGNOSIS — Z833 Family history of diabetes mellitus: Secondary | ICD-10-CM

## 2016-04-14 LAB — CBC
HEMATOCRIT: 27.5 % — AB (ref 36.0–46.0)
HEMOGLOBIN: 8.4 g/dL — AB (ref 12.0–15.0)
MCH: 20.2 pg — ABNORMAL LOW (ref 26.0–34.0)
MCHC: 30.5 g/dL (ref 30.0–36.0)
MCV: 66.3 fL — AB (ref 78.0–100.0)
Platelets: 223 10*3/uL (ref 150–400)
RBC: 4.15 MIL/uL (ref 3.87–5.11)
RDW: 17.1 % — AB (ref 11.5–15.5)
WBC: 11 10*3/uL — AB (ref 4.0–10.5)

## 2016-04-14 LAB — TYPE AND SCREEN
ABO/RH(D): O POS
ANTIBODY SCREEN: NEGATIVE

## 2016-04-14 LAB — OB RESULTS CONSOLE GBS: GBS: NEGATIVE

## 2016-04-14 LAB — RPR: RPR Ser Ql: NONREACTIVE

## 2016-04-14 LAB — OB RESULTS CONSOLE GC/CHLAMYDIA: Gonorrhea: NEGATIVE

## 2016-04-14 MED ORDER — TERBUTALINE SULFATE 1 MG/ML IJ SOLN: 0.2500 mg | Freq: Once | INTRAMUSCULAR | Status: DC | PRN

## 2016-04-14 MED ORDER — PRENATAL MULTIVITAMIN CH
1.0000 | ORAL_TABLET | Freq: Every day | ORAL | Status: DC
  Filled 2016-04-14 (×2): qty 1

## 2016-04-14 MED ORDER — BENZOCAINE-MENTHOL 20-0.5 % EX AERO
1.0000 "application " | INHALATION_SPRAY | CUTANEOUS | Status: DC | PRN
  Administered 2016-04-14 – 2016-04-15 (×2): 1 via TOPICAL
  Filled 2016-04-14 (×3): qty 56

## 2016-04-14 MED ORDER — OXYCODONE-ACETAMINOPHEN 5-325 MG PO TABS
1.0000 | ORAL_TABLET | ORAL | Status: DC | PRN
  Administered 2016-04-14 – 2016-04-15 (×2): 1 via ORAL
  Filled 2016-04-14 (×2): qty 1

## 2016-04-14 MED ORDER — LACTATED RINGERS IV SOLN: 500.0000 mL | INTRAVENOUS | Status: DC | PRN

## 2016-04-14 MED ORDER — OXYTOCIN 40 UNITS IN LACTATED RINGERS INFUSION - SIMPLE MED: 1.0000 m[IU]/min | INTRAVENOUS | Status: DC

## 2016-04-14 MED ORDER — DIBUCAINE 1 % RE OINT
1.0000 "application " | TOPICAL_OINTMENT | RECTAL | Status: DC | PRN
  Administered 2016-04-14 – 2016-04-15 (×2): 1 via RECTAL
  Filled 2016-04-14 (×3): qty 28

## 2016-04-14 MED ORDER — LIDOCAINE HCL (PF) 1 % IJ SOLN
30.0000 mL | INTRAMUSCULAR | Status: DC | PRN
  Filled 2016-04-14: qty 30

## 2016-04-14 MED ORDER — SENNOSIDES-DOCUSATE SODIUM 8.6-50 MG PO TABS
2.0000 | ORAL_TABLET | ORAL | Status: DC
  Administered 2016-04-15: 2 via ORAL
  Filled 2016-04-14: qty 2

## 2016-04-14 MED ORDER — OXYCODONE-ACETAMINOPHEN 5-325 MG PO TABS: 2.0000 | ORAL_TABLET | ORAL | Status: DC | PRN

## 2016-04-14 MED ORDER — ONDANSETRON HCL 4 MG/2ML IJ SOLN: 4.0000 mg | INTRAMUSCULAR | Status: DC | PRN

## 2016-04-14 MED ORDER — TETANUS-DIPHTH-ACELL PERTUSSIS 5-2.5-18.5 LF-MCG/0.5 IM SUSP
0.5000 mL | Freq: Once | INTRAMUSCULAR | Status: DC
  Filled 2016-04-14: qty 0.5

## 2016-04-14 MED ORDER — ZOLPIDEM TARTRATE 5 MG PO TABS: 5.0000 mg | ORAL_TABLET | Freq: Every evening | ORAL | Status: DC | PRN

## 2016-04-14 MED ORDER — ONDANSETRON HCL 4 MG/2ML IJ SOLN: 4.0000 mg | Freq: Four times a day (QID) | INTRAMUSCULAR | Status: DC | PRN

## 2016-04-14 MED ORDER — ONDANSETRON HCL 4 MG PO TABS: 4.0000 mg | ORAL_TABLET | ORAL | Status: DC | PRN

## 2016-04-14 MED ORDER — OXYCODONE-ACETAMINOPHEN 5-325 MG PO TABS: 1.0000 | ORAL_TABLET | ORAL | Status: DC | PRN

## 2016-04-14 MED ORDER — SIMETHICONE 80 MG PO CHEW: 80.0000 mg | CHEWABLE_TABLET | ORAL | Status: DC | PRN

## 2016-04-14 MED ORDER — FENTANYL CITRATE (PF) 100 MCG/2ML IJ SOLN
100.0000 ug | INTRAMUSCULAR | Status: DC | PRN
  Administered 2016-04-14 (×3): 100 ug via INTRAVENOUS
  Filled 2016-04-14 (×3): qty 2

## 2016-04-14 MED ORDER — MISOPROSTOL 25 MCG QUARTER TABLET
25.0000 ug | ORAL_TABLET | ORAL | Status: DC
  Administered 2016-04-14: 25 ug via VAGINAL
  Filled 2016-04-14: qty 0.25

## 2016-04-14 MED ORDER — ACETAMINOPHEN 325 MG PO TABS: 650.0000 mg | ORAL_TABLET | ORAL | Status: DC | PRN

## 2016-04-14 MED ORDER — COCONUT OIL OIL
1.0000 "application " | TOPICAL_OIL | Status: DC | PRN
  Filled 2016-04-14: qty 120

## 2016-04-14 MED ORDER — DIPHENHYDRAMINE HCL 25 MG PO CAPS: 25.0000 mg | ORAL_CAPSULE | Freq: Four times a day (QID) | ORAL | Status: DC | PRN

## 2016-04-14 MED ORDER — IBUPROFEN 600 MG PO TABS
600.0000 mg | ORAL_TABLET | Freq: Four times a day (QID) | ORAL | Status: DC
  Administered 2016-04-14 – 2016-04-15 (×5): 600 mg via ORAL
  Filled 2016-04-14 (×6): qty 1

## 2016-04-14 MED ORDER — LACTATED RINGERS IV SOLN
INTRAVENOUS | Status: DC
  Administered 2016-04-14: 1000 mL via INTRAVENOUS

## 2016-04-14 MED ORDER — WITCH HAZEL-GLYCERIN EX PADS
1.0000 "application " | MEDICATED_PAD | CUTANEOUS | Status: DC | PRN
  Administered 2016-04-14 – 2016-04-15 (×2): 1 via TOPICAL

## 2016-04-14 MED ORDER — SOD CITRATE-CITRIC ACID 500-334 MG/5ML PO SOLN: 30.0000 mL | ORAL | Status: DC | PRN

## 2016-04-14 MED ORDER — OXYTOCIN BOLUS FROM INFUSION: 500.0000 mL | Freq: Once | INTRAVENOUS | Status: DC

## 2016-04-14 MED ORDER — OXYTOCIN 40 UNITS IN LACTATED RINGERS INFUSION - SIMPLE MED
2.5000 [IU]/h | INTRAVENOUS | Status: DC
  Filled 2016-04-14: qty 1000

## 2016-04-14 NOTE — Progress Notes (Signed)
Tech in room

## 2016-04-14 NOTE — Progress Notes (Signed)
Pain med declined

## 2016-04-14 NOTE — H&P (Signed)
LABOR AND DELIVERY ADMISSION HISTORY AND PHYSICAL NOTE  Mary Hays is a 25 y.o. female G68P1011 with IUP at [redacted]w[redacted]d by 6 wk [place] presenting for IOL for post dates.   She reports positive fetal movement. She denies leakage of fluid or vaginal bleeding.   Prenatal History/Complications: Normal  Past Medical History: Past Medical History:  Diagnosis Date  . Medical history non-contributory     Past Surgical History: Past Surgical History:  Procedure Laterality Date  . APPENDECTOMY      Obstetrical History: OB History    Gravida Para Term Preterm AB Living   3 1 1   1 1    SAB TAB Ectopic Multiple Live Births   1       1      Social History: Social History   Social History  . Marital status: Married    Spouse name: N/A  . Number of children: N/A  . Years of education: N/A   Social History Main Topics  . Smoking status: Never Smoker  . Smokeless tobacco: Never Used  . Alcohol use No  . Drug use: No  . Sexual activity: Yes   Other Topics Concern  . None   Social History Narrative  . None    Family History: Family History  Problem Relation Age of Onset  . Diabetes Father     Allergies: No Known Allergies  Prescriptions Prior to Admission  Medication Sig Dispense Refill Last Dose  . ferrous sulfate 325 (65 FE) MG tablet Take 1 tablet (325 mg total) by mouth 2 (two) times daily with a meal. 60 tablet 0 04/13/2016 at Unknown time  . Prenatal Vit-Fe Fumarate-FA (PRENATAL MULTIVITAMIN) TABS tablet Take 1 tablet by mouth daily.    04/13/2016 at Unknown time     Review of Systems   All systems reviewed and negative except as stated in HPI  Blood pressure 110/65, pulse 86, temperature 98.2 F (36.8 C), temperature source Oral, resp. rate 16, height 5' (1.524 m), weight 145 lb (65.8 kg), last menstrual period 07/19/2015. General appearance: alert, cooperative and appears stated age Lungs: clear to auscultation bilaterally Heart: regular rate and  rhythm Abdomen: soft, non-tender; bowel sounds normal Extremities: No calf swelling or tenderness Presentation: cephalic Fetal monitoring: 120, mod var, +accels, no decels Uterine activity: infrequent Dilation: 2 Effacement (%): 70 Station: -2 Exam by:: Dr Verdene Rio   Prenatal labs: ABO, Rh: --/--/O POS (08/12 [id number]) Antibody: NEG (08/12 0742) Rubella: !Error! RPR: NON REAC (07/06 0947)  HBsAg: NEGATIVE (02/15 1509)  HIV: NONREACTIVE (07/06 0947)  GBS: Negative (08/12 0820)  1 hr Glucola: 119 Genetic screening:  declined Anatomy US: Normal  Prenatal Transfer Tool  Maternal Diabetes: No Genetic Screening: Declined Maternal Ultrasounds/Referrals: Normal Fetal Ultrasounds or other Referrals:  None Maternal Substance Abuse:  No Significant Maternal Medications:  None Significant Maternal Lab Results: None  Results for orders placed or performed during the hospital encounter of 04/14/16 (from the past 24 hour(s))  CBC   Collection Time: 04/14/16  7:42 AM  Result Value Ref Range   WBC 11.0 (H) 4.0 - 10.5 K/uL   RBC 4.15 3.87 - 5.11 MIL/uL   Hemoglobin 8.4 (L) 12.0 - 15.0 g/dL   HCT 27.5 (L) 36.0 - 46.0 %   MCV 66.3 (L) 78.0 - 100.0 fL   MCH 20.2 (L) 26.0 - 34.0 pg   MCHC 30.5 30.0 - 36.0 g/dL   RDW 17.1 (H) 11.5 - 15.5 %   Platelets 223 150 -  400 K/uL  Type and screen   Collection Time: 04/14/16  7:42 AM  Result Value Ref Range   ABO/RH(D) O POS    Antibody Screen NEG    Sample Expiration 04/17/2016   OB RESULT CONSOLE Group B Strep   Collection Time: 04/14/16  8:20 AM  Result Value Ref Range   GBS Negative   OB RESULTS CONSOLE GC/Chlamydia   Collection Time: 04/14/16  8:25 AM  Result Value Ref Range   Gonorrhea Negative     Patient Active Problem List   Diagnosis Date Noted  . Post term pregnancy over 40 weeks 04/14/2016  . Supervision of normal pregnancy in third trimester 10/19/2015    Assessment: Mary Hays is a 25 y.o. G3P1011 at [redacted]w[redacted]d here for  [place].  #Labor: Induction with cytotec to start #Pain: Epidural when requested #FWB: Cat I #ID:  GBS negative  #MOF: Breast #MOC: Natural family planning #Circ:  [place], DO 04/14/2016, 12:27 PM

## 2016-04-15 LAB — CBC
HCT: 25.8 % — ABNORMAL LOW (ref 36.0–46.0)
Hemoglobin: 7.9 g/dL — ABNORMAL LOW (ref 12.0–15.0)
MCH: 20.2 pg — AB (ref 26.0–34.0)
MCHC: 30.6 g/dL (ref 30.0–36.0)
MCV: 66 fL — ABNORMAL LOW (ref 78.0–100.0)
PLATELETS: 204 10*3/uL (ref 150–400)
RBC: 3.91 MIL/uL (ref 3.87–5.11)
RDW: 17.4 % — ABNORMAL HIGH (ref 11.5–15.5)
WBC: 16 10*3/uL — ABNORMAL HIGH (ref 4.0–10.5)

## 2016-04-15 MED ORDER — IBUPROFEN 600 MG PO TABS: 600.0000 mg | ORAL_TABLET | Freq: Four times a day (QID) | ORAL | 0 refills | Status: DC

## 2016-04-15 NOTE — Lactation Note (Signed)
This note was copied from a baby's chart. Lactation Consultation Note  Patient Name: Mary Hays [id number] Date: 04/15/2016 Reason for consult: Initial assessment Breastfeeding consultation services and support information given to mom.  She states she breastfed her first baby for one month.  Mom states she has no milk.  Reviewed colostrum and milk coming to volume.  She reports baby is latching easily and no concerns.  Encouraged to call for assist/concerns prn.  Maternal Data    Feeding Feeding Type: Breast Fed  LATCH Score/Interventions                      Lactation Tools Discussed/Used     Consult Status Consult Status: Follow-up Date: 04/16/16 Follow-up type: In-patient    Ave Filter 04/15/2016, 2:32 PM

## 2016-04-15 NOTE — Discharge Summary (Signed)
OB Discharge Summary  Patient Name: Mary Hays DOB: 1990-09-14 MRN: [id number]  Date of admission: 04/14/2016 Delivering MD: Isaias Sakai   Date of discharge: 04/15/2016  Admitting diagnosis: induction Intrauterine pregnancy: [redacted]w[redacted]d     Secondary diagnosis:Active Problems:   Post term pregnancy over 40 weeks  Additional problems:none     Discharge diagnosis: Term Pregnancy Delivered                                                                     Post partum procedures:none  Augmentation: none  Complications: None  Hospital course:  Onset of Labor With Vaginal Delivery     25 y.o. yo [id number] at [redacted]w[redacted]d was admitted in Active Labor on 04/14/2016. Patient had an uncomplicated labor course as follows:  Membrane Rupture Time/Date: 4:35 PM ,04/14/2016   Intrapartum Procedures: Episiotomy: None [1]                                         Lacerations:  1st degree [2]  Patient had a delivery of a Viable infant. 04/14/2016  Information for the patient's newborn:  Ahley, [place] [id number]  Delivery Method: Vaginal, Spontaneous Delivery (Filed from Delivery Summary)    Pateint had an uncomplicated postpartum course.  She is ambulating, tolerating a regular diet, passing flatus, and urinating well. Patient is discharged home in stable condition on 04/15/16.    Physical exam Vitals:   04/14/16 2118 04/15/16 0003 04/15/16 0500 04/15/16 0818  BP: 110/64 (!) 109/43 111/63 (!) 103/58  Pulse: 70 67 (!) 58 (!) 51  Resp: 18 18 18 18   Temp: 98.9 F (37.2 C) 99.2 F (37.3 C) 98.1 F (36.7 C) 98.4 F (36.9 C)  TempSrc: Oral Oral  Oral  Weight:      Height:       General: alert, cooperative and no distress Lochia: appropriate Uterine Fundus: firm Incision: N/A DVT Evaluation: No evidence of DVT seen on physical exam. Negative Homan's sign. Labs: Lab Results  Component Value Date   WBC 16.0 (H) 04/15/2016   HGB 7.9 (L) 04/15/2016   HCT 25.8 (L) 04/15/2016    MCV 66.0 (L) 04/15/2016   PLT 204 04/15/2016   No flowsheet data found.  Discharge instruction: per After Visit Summary and "Baby and Me Booklet".  After Visit Meds:    Medication List    TAKE these medications   ferrous sulfate 325 (65 FE) MG tablet Take 1 tablet (325 mg total) by mouth 2 (two) times daily with a meal.   ibuprofen 600 MG tablet Commonly known as:  ADVIL,MOTRIN Take 1 tablet (600 mg total) by mouth every 6 (six) hours.   prenatal multivitamin Tabs tablet Take 1 tablet by mouth daily.       Diet: routine diet  Activity: Advance as tolerated. Pelvic rest for 6 weeks.   Outpatient follow up:6 weeks Follow up Appt:No future appointments. Follow up visit: No Follow-up on file.  Postpartum contraception: Condoms  Newborn Data: Live born female  Birth Weight: 8 lb 6.6 oz (3815 g) APGAR: 4, 7  Baby Feeding: Breast Disposition:home with mother   04/15/2016  Koren Shiver, CNM

## 2016-04-16 ENCOUNTER — Inpatient Hospital Stay (HOSPITAL_COMMUNITY): Payer: Medicaid Other

## 2016-04-25 ENCOUNTER — Encounter: Payer: Self-pay | Admitting: *Deleted

## 2016-04-27 ENCOUNTER — Encounter: Payer: Self-pay | Admitting: *Deleted

## 2016-06-07 ENCOUNTER — Ambulatory Visit (INDEPENDENT_AMBULATORY_CARE_PROVIDER_SITE_OTHER): Payer: Medicaid Other | Admitting: Advanced Practice Midwife

## 2016-06-07 ENCOUNTER — Encounter: Payer: Self-pay | Admitting: Advanced Practice Midwife

## 2016-06-07 NOTE — Patient Instructions (Signed)
Natural Family Planning Natural Family Planning (NFP) is a type of birth control without using any form of contraception. Women who use NFP should not have sexual intercourse when the ovary produces an egg (ovulation) during the menstrual cycle. The NFP method is safe and can prevent pregnancy. It is 75% effective when practiced right. The man needs to also understand this method of birth control and the woman needs to be aware of how her body functions during her menstrual cycle. NFP can also be used as a method of getting pregnant.  HOW THE NFP METHOD WORKS  A woman's menstrual period usually happens every 28-30 days (it can vary from 23-35 days).  Ovulation happens 12-14 days before the start of the next menstrual period (the fertile period). The egg is fertile for 24 hours and the sperm can live for 3 days or more. If there is sexual intercourse at this time, pregnancy can occur. THERE ARE MANY TYPES OF NFP METHODS USED TO PREVENT PREGNANCY  The basal body temperature method. Often times, there is a slight increase of body temperature when a woman ovulates. Take your temperature every morning before getting out of bed. Write the temperature on a chart. An increase in the temperature shows ovulation has happened. Do not have sexual intercourse from the menstrual period up to three days after the increase in the temperature. Note that the body temperature may increase as a result of fever, restless sleep, and working schedules.  The ovulation cervical mucus method. During the menstrual cycle, the cervical mucus changes from dry and sticky to wet and slippery. Check the mucus of the vagina every day to look for these changes. Just before ovulation, the mucus becomes wet and slippery. On the last day of wetness, ovulation happens. To avoid getting pregnant, sexual intercourse is safe for about 10 days after the menstrual period and on the dry mucus days. Do not have sexual intercourse when the mucus  starts to show up and not until 4 days after the wet and slippery mucus goes away. Sexual intercourse after the 4 days have passed until the menstrual period starts is a safe time. Note that the mucus from the vagina can increase because of a vaginal or cervical infection, lubricants, some medicines, and sexual excitement.  The symptothermal method. This method uses both the temperature and the ovulation methods. Combine the two methods above to prevent pregnancy.  The calendar method. Record your menstrual periods and length of the cycles for 6 months. This is helpful when the menstrual cycle varies in the length of the cycle. The length of a menstrual cycle is from day 1 of the present menstrual period to day 1 of the next menstrual period. Then, find your fertile days of the month and do not have sexual intercourse during that time. You may need help from your health care provider to find out your fertile days. There are some signs of ovulation that may be helpful when trying to find the time of ovulation. This includes vaginal spotting or abdominal cramps during the middle of your menstrual cycle. Not all women have these symptoms. YOU SHOULD NOT USE NFP IF:  You have very irregular menstrual periods and may skip months.  You have abnormal bleeding.  You have a vaginal or cervical infection.  You are on medicines that can affect the vaginal mucus or body temperature. These medicines include antibiotics, thyroid medicines, and antihistamines (cold and allergy medicine).   This information is not intended to replace  advice given to you by your health care provider. Make sure you discuss any questions you have with your health care provider.   Document Released: 02/06/2008 Document Revised: 08/25/2013 Document Reviewed: 02/20/2013 Elsevier Interactive Patient Education [employer].

## 2016-06-07 NOTE — Progress Notes (Signed)
Subjective:     Mary Hays is a 25 y.o. female who presents for a postpartum visit. She is 7 weeks postpartum following a spontaneous vaginal delivery. I have fully reviewed the prenatal and intrapartum course. The delivery was at 41 gestational weeks. Outcome: spontaneous vaginal delivery. Anesthesia: none. Postpartum course has been unremarkable. Baby's course has been unremarkable. Baby is feeding by bottle - Similac Advance. Bleeding no bleeding. Bowel function is normal. Bladder function is normal. Patient is not sexually active. Contraception method is condoms. Postpartum depression screening: negative.  The following portions of the patient's history were reviewed and updated as appropriate: allergies, current medications, past family history, past medical history, past social history, past surgical history and problem list.  Review of Systems Pertinent items are noted in HPI.   Objective:    BP 92/76   Pulse 77   Wt 118 lb 14.4 oz (53.9 kg)   LMP 06/04/2016 (Exact Date)   Breastfeeding? No   BMI 23.22 kg/m   General:  alert, cooperative and no distress   Breasts:  inspection negative, no nipple discharge or bleeding, no masses or nodularity palpable  Lungs: clear to auscultation bilaterally  Heart:  regular rate and rhythm, S1, S2 normal, no murmur, click, rub or gallop  Abdomen: soft, non-tender; bowel sounds normal; no masses,  no organomegaly   Vulva:  not evaluated  Vagina: not evaluated  Cervix:  n/a  Corpus: not examined  Adnexa:  not evaluated  Rectal Exam: Not performed.        Assessment:     Normal postpartum exam. Pap smear not done at today's visit.   Plan:    1. Contraception: rhythm method 2.  Discussed NFP as addition to condom use 3. Follow up in: 1 year or as needed.

## 2016-11-16 DIAGNOSIS — D249 Benign neoplasm of unspecified breast: Secondary | ICD-10-CM | POA: Insufficient documentation

## 2016-11-16 DIAGNOSIS — D649 Anemia, unspecified: Secondary | ICD-10-CM

## 2016-11-16 HISTORY — DX: Anemia, unspecified: D64.9

## 2016-11-19 ENCOUNTER — Other Ambulatory Visit: Payer: Self-pay | Admitting: Family Medicine

## 2016-11-19 DIAGNOSIS — N6009 Solitary cyst of unspecified breast: Secondary | ICD-10-CM

## 2016-11-26 ENCOUNTER — Ambulatory Visit
Admission: RE | Admit: 2016-11-26 | Discharge: 2016-11-26 | Disposition: A | Discharge status: Home / Self Care | Payer: Medicaid Other | Source: Ambulatory Visit | Attending: Family Medicine | Admitting: Family Medicine

## 2016-11-26 DIAGNOSIS — N6009 Solitary cyst of unspecified breast: Secondary | ICD-10-CM

## 2019-03-16 ENCOUNTER — Encounter: Payer: Self-pay | Admitting: Family Medicine

## 2019-03-16 ENCOUNTER — Ambulatory Visit (INDEPENDENT_AMBULATORY_CARE_PROVIDER_SITE_OTHER): Payer: Medicaid Other | Admitting: General Practice

## 2019-03-16 ENCOUNTER — Other Ambulatory Visit: Payer: Self-pay

## 2019-03-16 DIAGNOSIS — Z3201 Encounter for pregnancy test, result positive: Secondary | ICD-10-CM

## 2019-03-16 LAB — POCT PREGNANCY, URINE: Preg Test, Ur: POSITIVE — AB

## 2019-03-16 NOTE — Progress Notes (Signed)
Patient seen and assessed by nursing staff during this encounter. I have reviewed the chart and agree with the documentation and plan.  Marcille Buffy DNP, CNM  03/16/19  5:20 PM

## 2019-03-16 NOTE — Progress Notes (Signed)
Patient presents to office today for UPT. UPT +. Patient reports first positive home test 1.5 months ago. LMP 01/14/19 EDD 10/21/19 [redacted]w[redacted]d. Patient reports only taking PNV. Advised patient to begin OB care. Patient verbalized understanding.  Koren Bound RN BSN 03/16/19

## 2019-03-27 ENCOUNTER — Telehealth: Payer: Self-pay | Admitting: Family Medicine

## 2019-03-27 NOTE — Telephone Encounter (Signed)
Spoke to patient about her appointment on 7/27 @ 8:30. Patient instructed that this visit will be a phone visit and she does not have to come to the office for this appointment. Patient instructed to be available around her appointment. Patient verbalized understanding.

## 2019-03-30 ENCOUNTER — Other Ambulatory Visit: Payer: Self-pay

## 2019-03-30 ENCOUNTER — Ambulatory Visit (INDEPENDENT_AMBULATORY_CARE_PROVIDER_SITE_OTHER): Payer: Self-pay | Admitting: General Practice

## 2019-03-30 DIAGNOSIS — Z3490 Encounter for supervision of normal pregnancy, unspecified, unspecified trimester: Secondary | ICD-10-CM | POA: Insufficient documentation

## 2019-03-30 DIAGNOSIS — Z8279 Family history of other congenital malformations, deformations and chromosomal abnormalities: Secondary | ICD-10-CM

## 2019-03-30 DIAGNOSIS — Z3491 Encounter for supervision of normal pregnancy, unspecified, first trimester: Secondary | ICD-10-CM

## 2019-03-30 DIAGNOSIS — O0990 Supervision of high risk pregnancy, unspecified, unspecified trimester: Secondary | ICD-10-CM | POA: Insufficient documentation

## 2019-03-30 NOTE — Progress Notes (Signed)
I connected with  Mary Hays on 03/30/19 at  8:30 AM EDT by telephone and verified that I am speaking with the correct person using two identifiers.   I discussed the limitations, risks, security and privacy concerns of performing an evaluation and management service by telephone and the availability of in person appointments. I also discussed with the patient that there may be a patient responsible charge related to this service. The patient expressed understanding and agreed to proceed.  New OB intake completed today via telephone. Low risk pregnancy. Scheduled Anatomy U/S 9/30 @ 315pm. Patient was signed up for [place]- already has BP cuff at home. Discussed BRX App with patient and that she will need to log in weekly with her BP. Told patient to bring her BP cuff to her appt so we can make sure she knows how to check her BP properly and to ensure her cuff is accurate. Patient verbalized understanding. Patient will need OB panel, a1c, SMA, CF, Panorama, pap & home form completed at appt- Hgb Electrophoresis not necessary as patient was tested in 2017. Patient will return 8/10 for OB visit with Darrol Poke.  Derinda Late, RN 03/30/2019  9:05 AM

## 2019-04-13 ENCOUNTER — Encounter: Payer: Self-pay | Admitting: Certified Nurse Midwife

## 2019-04-14 ENCOUNTER — Other Ambulatory Visit: Payer: Self-pay

## 2019-04-14 ENCOUNTER — Ambulatory Visit (INDEPENDENT_AMBULATORY_CARE_PROVIDER_SITE_OTHER): Payer: Medicaid Other | Admitting: Advanced Practice Midwife

## 2019-04-14 ENCOUNTER — Other Ambulatory Visit (HOSPITAL_COMMUNITY)
Admission: RE | Admit: 2019-04-14 | Discharge: 2019-04-14 | Disposition: A | Discharge status: Home / Self Care | Payer: Medicaid Other | Source: Ambulatory Visit | Attending: Advanced Practice Midwife | Admitting: Advanced Practice Midwife

## 2019-04-14 VITALS — BP 105/71 | HR 86 | Wt 124.6 lb

## 2019-04-14 DIAGNOSIS — Z8279 Family history of other congenital malformations, deformations and chromosomal abnormalities: Secondary | ICD-10-CM

## 2019-04-14 DIAGNOSIS — R109 Unspecified abdominal pain: Secondary | ICD-10-CM

## 2019-04-14 DIAGNOSIS — Z3491 Encounter for supervision of normal pregnancy, unspecified, first trimester: Secondary | ICD-10-CM | POA: Diagnosis present

## 2019-04-14 DIAGNOSIS — O26891 Other specified pregnancy related conditions, first trimester: Secondary | ICD-10-CM

## 2019-04-14 DIAGNOSIS — Z3A12 12 weeks gestation of pregnancy: Secondary | ICD-10-CM

## 2019-04-14 DIAGNOSIS — O2341 Unspecified infection of urinary tract in pregnancy, first trimester: Secondary | ICD-10-CM

## 2019-04-14 LAB — POCT URINALYSIS DIP (DEVICE)
Bilirubin Urine: NEGATIVE
Glucose, UA: NEGATIVE mg/dL
Hgb urine dipstick: NEGATIVE
Ketones, ur: NEGATIVE mg/dL
Nitrite: NEGATIVE
Protein, ur: NEGATIVE mg/dL
Specific Gravity, Urine: 1.02 (ref 1.005–1.030)
Urobilinogen, UA: 0.2 mg/dL (ref 0.0–1.0)
pH: 7.5 (ref 5.0–8.0)

## 2019-04-14 NOTE — Progress Notes (Signed)
Has bp cuff at home knows how to use it.

## 2019-04-14 NOTE — Patient Instructions (Signed)
How a Baby Grows During Pregnancy  Pregnancy begins when a female's sperm enters a female's egg (fertilization). Fertilization usually happens in one of the tubes (fallopian tubes) that connect the ovaries to the womb (uterus). The fertilized egg moves down the fallopian tube to the uterus. Once it reaches the uterus, it implants into the lining of the uterus and begins to grow. For the first 10 weeks, the fertilized egg is called an embryo. After 10 weeks, it is called a fetus. As the fetus continues to grow, it receives oxygen and nutrients through tissue (placenta) that grows to support the developing baby. The placenta is the life support system for the baby. It provides oxygen and nutrition and removes waste. Learning as much as you can about your pregnancy and how your baby is developing can help you enjoy the experience. It can also make you aware of when there might be a problem and when to ask questions. How long does a typical pregnancy last? A pregnancy usually lasts 280 days, or about 40 weeks. Pregnancy is divided into three periods of growth, also called trimesters:  First trimester: 0-12 weeks.  Second trimester: 13-27 weeks.  Third trimester: 28-40 weeks. The day when your baby is ready to be born (full term) is your estimated date of delivery. How does my baby develop month by month? First month  The fertilized egg attaches to the inside of the uterus.  Some cells will form the placenta. Others will form the fetus.  The arms, legs, brain, spinal cord, lungs, and heart begin to develop.  At the end of the first month, the heart begins to beat. Second month  The bones, inner ear, eyelids, hands, and feet form.  The genitals develop.  By the end of 8 weeks, all major organs are developing. Third month  All of the internal organs are forming.  Teeth develop below the gums.  Bones and muscles begin to grow. The spine can flex.  The skin is transparent.  Fingernails  and toenails begin to form.  Arms and legs continue to grow longer, and hands and feet develop.  The fetus is about 3 inches (7.6 cm) long. Fourth month  The placenta is completely formed.  The external sex organs, neck, outer ear, eyebrows, eyelids, and fingernails are formed.  The fetus can hear, swallow, and move its arms and legs.  The kidneys begin to produce urine.  The skin is covered with a white, waxy coating (vernix) and very fine hair (lanugo). Fifth month  The fetus moves around more and can be felt for the first time (quickening).  The fetus starts to sleep and wake up and may begin to suck its finger.  The nails grow to the end of the fingers.  The organ in the digestive system that makes bile (gallbladder) functions and helps to digest nutrients.  If your baby is a girl, eggs are present in her ovaries. If your baby is a boy, testicles start to move down into his scrotum. Sixth month  The lungs are formed.  The eyes open. The brain continues to develop.  Your baby has fingerprints and toe prints. Your baby's hair grows thicker.  At the end of the second trimester, the fetus is about 9 inches (22.9 cm) long. Seventh month  The fetus kicks and stretches.  The eyes are developed enough to sense changes in light.  The hands can make a grasping motion.  The fetus responds to sound. Eighth month  All  organs and body systems are fully developed and functioning.  Bones harden, and taste buds develop. The fetus may hiccup.  Certain areas of the brain are still developing. The skull remains soft. Ninth month  The fetus gains about  lb (0.23 kg) each week.  The lungs are fully developed.  Patterns of sleep develop.  The fetus's head typically moves into a head-down position (vertex) in the uterus to prepare for birth.  The fetus weighs 6-9 lb (2.72-4.08 kg) and is 19-20 inches (48.26-50.8 cm) long. What can I do to have a healthy pregnancy and help  my baby develop? General instructions  Take prenatal vitamins as directed by your health care provider. These include vitamins such as folic acid, iron, calcium, and vitamin D. They are important for healthy development.  Take medicines only as directed by your health care provider. Read labels and ask a pharmacist or your health care provider whether over-the-counter medicines, supplements, and prescription drugs are safe to take during pregnancy.  Keep all follow-up visits as directed by your health care provider. This is important. Follow-up visits include prenatal care and screening tests. How do I know if my baby is developing well? At each prenatal visit, your health care provider will do several different tests to check on your health and keep track of your baby's development. These include:  Fundal height and position. ? Your health care provider will measure your growing belly from your pubic bone to the top of the uterus using a tape measure. ? Your health care provider will also feel your belly to determine your baby's position.  Heartbeat. ? An ultrasound in the first trimester can confirm pregnancy and show a heartbeat, depending on how far along you are. ? Your health care provider will check your baby's heart rate at every prenatal visit.  Second trimester ultrasound. ? This ultrasound checks your baby's development. It also may show your baby's gender. What should I do if I have concerns about my baby's development? Always talk with your health care provider about any concerns that you may have about your pregnancy and your baby. Summary  A pregnancy usually lasts 280 days, or about 40 weeks. Pregnancy is divided into three periods of growth, also called trimesters.  Your health care provider will monitor your baby's growth and development throughout your pregnancy.  Follow your health care provider's recommendations about taking prenatal vitamins and medicines during  your pregnancy.  Talk with your health care provider if you have any concerns about your pregnancy or your developing baby. This information is not intended to replace advice given to you by your health care provider. Make sure you discuss any questions you have with your health care provider. Document Released: 02/06/2008 Document Revised: 12/11/2018 Document Reviewed: 07/03/2017 Elsevier Patient Education  2020 [employer].

## 2019-04-14 NOTE — Progress Notes (Signed)
INITIAL PRENATAL VISIT  Subjective:   Mary Hays is being seen today for her first obstetrical visit.  This is a planned pregnancy. This is a desired pregnancy.  She is at [redacted]w[redacted]d gestation by LMP. Her obstetrical history is significant for nothing. Relationship with FOB: spouse, living together. Patient does intend to breast feed. Pregnancy history fully reviewed.  Patient reports mild intermittent right groin pain. None now. Denies VB, urinary complaints, GI complaints, appetite change, vaginal discharge.  Review of Systems:   Review of Systems  Objective:    Obstetric History OB History  Gravida Para Term Preterm AB Living  4 2 2  0 1 2  SAB TAB Ectopic Multiple Live Births  1 0 0 0 2    # Outcome Date GA Lbr Len/2nd Weight Sex Delivery Anes PTL Lv  4 Current           3 Term 04/14/16 [redacted]w[redacted]d 09:15 / 00:22 8 lb 6.6 oz (3.815 kg) M Vag-Spont None  LIV  2 SAB 07/01/15          1 Term 03/13/13 [redacted]w[redacted]d  6 lb 8 oz (2.948 kg) M Vag-Spont EPI  LIV    Past Medical History:  Diagnosis Date  . Medical history non-contributory     Past Surgical History:  Procedure Laterality Date  . APPENDECTOMY      Current Outpatient Medications on File Prior to Visit  Medication Sig Dispense Refill  . Prenatal Vit-Fe Fumarate-FA (PRENATAL MULTIVITAMIN) TABS tablet Take 1 tablet by mouth daily.      No current facility-administered medications on file prior to visit.     No Known Allergies  Social History:  reports that she has never smoked. She has never used smokeless tobacco. She reports that she does not drink alcohol or use drugs.  Family History  Problem Relation Age of Onset  . Diabetes Father   . Intellectual disability Sister    Sister has Down Syndrome. Pt does not know what type.   The following portions of the patient's history were reviewed and updated as appropriate: allergies, current medications, past family history, past medical history, past social history, past  surgical history and problem list.  Review of Systems Review of Systems  Constitutional: Negative for appetite change, chills and fever.  Respiratory: Negative for shortness of breath.   Cardiovascular: Negative for chest pain.  Gastrointestinal: Negative for abdominal pain, constipation, diarrhea, nausea and vomiting.  Genitourinary: Negative for difficulty urinating, dysuria, flank pain, frequency, hematuria, pelvic pain, vaginal bleeding, vaginal discharge and vaginal pain.  Musculoskeletal:       Right groin      Physical Exam:  BP 105/71   Pulse 86   Wt 124 lb 9.6 oz (56.5 kg)   LMP 01/15/2019 (Exact Date)   BMI 24.33 kg/m  CONSTITUTIONAL: Well-developed, well-nourished female in no acute distress.  HENT:  Normocephalic, atraumatic, Oropharynx is clear and moist EYES: Conjunctivae normal. No scleral icterus.  NECK: Normal range of motion, supple, no masses.  SKIN: Skin is warm and dry. No rash noted. Not diaphoretic. No erythema. No pallor. MUSCULOSKELETAL: Normal range of motion. No tenderness.  No cyanosis, clubbing, or edema.   NEUROLOGIC: Alert and oriented to person, place, and time. Normal muscle tone coordination.  PSYCHIATRIC: Normal mood and affect. Normal behavior. Normal judgment and thought content. CARDIOVASCULAR: Normal heart rate noted RESPIRATORY:  Effort and breath sounds normal, no problems with respiration noted. BREASTS: Declined ABDOMEN: Soft, normal bowel sounds, no distention noted.  No tenderness, rebound or guarding. Fundal ht: 13 PELVIC: Normal appearing external genitalia; normal appearing vaginal mucosa and cervix.  No abnormal discharge noted.  Pap smear obtained.  Normal uterine size, no other palpable masses, no uterine or adnexal tenderness or masses. FHR: 162   Assessment:    Pregnancy: G1P0000 1. Encounter for supervision of low-risk pregnancy in first trimester  - Genetic Screening - Cytology - PAP( [place]) - HgB A1c - Obstetric  Panel, Including HIV - POC Urinalysis Dipstick OB  2. Family history of Down syndrome  - Genetic Screening - Cytology - PAP( [place]) - HgB A1c - Obstetric Panel, Including HIV  3. Abdominal pain during pregnancy in first trimester. None now--low suspicion for ectopic pregnancy due absence of pain now and mild nature.  - Urine culture - US OB Comp Less 14 Wks; Future     Plan:     Initial labs drawn. Prenatal vitamins. Problem list reviewed and updated. Genetic screening discussed: NIPS/First trimester screen/Quad/AFP ordered. Role of ultrasound in pregnancy discussed; Anatomy US: ordered. Amniocentesis discussed: not indicated. Follow up in 4 weeks. Discussed clinic routines, schedule of care and testing, genetic screening options, involvement of students and residents under the direct supervision of APPs and doctors and presence of female providers. Pt verbalized understanding.   Tamala Julian, [place], [place] 04/14/2019 9:25 AM

## 2019-04-15 LAB — OBSTETRIC PANEL, INCLUDING HIV
Antibody Screen: NEGATIVE
Basophils Absolute: 0 10*3/uL (ref 0.0–0.2)
Basos: 0 %
EOS (ABSOLUTE): 0.1 10*3/uL (ref 0.0–0.4)
Eos: 1 %
HIV Screen 4th Generation wRfx: NONREACTIVE
Hematocrit: 35.4 % (ref 34.0–46.6)
Hemoglobin: 11.7 g/dL (ref 11.1–15.9)
Hepatitis B Surface Ag: NEGATIVE
Immature Grans (Abs): 0 10*3/uL (ref 0.0–0.1)
Immature Granulocytes: 1 %
Lymphocytes Absolute: 2.7 10*3/uL (ref 0.7–3.1)
Lymphs: 30 %
MCH: 26.3 pg — ABNORMAL LOW (ref 26.6–33.0)
MCHC: 33.1 g/dL (ref 31.5–35.7)
MCV: 80 fL (ref 79–97)
Monocytes Absolute: 0.5 10*3/uL (ref 0.1–0.9)
Monocytes: 6 %
Neutrophils Absolute: 5.6 10*3/uL (ref 1.4–7.0)
Neutrophils: 62 %
Platelets: 250 10*3/uL (ref 150–450)
RBC: 4.45 x10E6/uL (ref 3.77–5.28)
RDW: 15.9 % — ABNORMAL HIGH (ref 11.7–15.4)
RPR Ser Ql: NONREACTIVE
Rh Factor: POSITIVE
Rubella Antibodies, IGG: 5.31 index (ref >0.99)
WBC: 8.9 10*3/uL (ref 3.4–10.8)

## 2019-04-15 LAB — HEMOGLOBIN A1C
Est. average glucose Bld gHb Est-mCnc: 105 mg/dL
Hgb A1c MFr Bld: 5.3 % (ref 4.8–5.6)

## 2019-04-16 LAB — CYTOLOGY - PAP
Chlamydia: NEGATIVE
Diagnosis: NEGATIVE
Neisseria Gonorrhea: NEGATIVE

## 2019-04-16 LAB — URINE CULTURE

## 2019-04-20 ENCOUNTER — Telehealth: Payer: Self-pay | Admitting: *Deleted

## 2019-04-20 NOTE — Telephone Encounter (Signed)
Mary Hays called this afternoon and left a message she is calling about a test request.

## 2019-04-21 ENCOUNTER — Telehealth: Payer: Self-pay | Admitting: Family Medicine

## 2019-04-21 MED ORDER — NITROFURANTOIN MONOHYD MACRO 100 MG PO CAPS: 100.0000 mg | ORAL_CAPSULE | Freq: Two times a day (BID) | ORAL | 1 refills | Status: DC

## 2019-04-21 NOTE — Addendum Note (Signed)
Addended by: Manya Silvas on: 04/21/2019 03:39 PM   Modules accepted: Orders

## 2019-04-21 NOTE — Telephone Encounter (Signed)
Spoke to patient about her appointment on 8/19 @ 2:50. Patient instructed to wear a face mask for the entire appointment and no visitors are allowed with her during the visit. Patient screened for covid symptoms and denied having any. Patient stated that she would like her results from her lab work to know what the gender of the baby is. Patient stated that the did not receive a purple card with the information to obtain the results herself.

## 2019-04-22 ENCOUNTER — Other Ambulatory Visit: Payer: Self-pay | Admitting: Advanced Practice Midwife

## 2019-04-22 ENCOUNTER — Ambulatory Visit: Payer: Medicaid Other

## 2019-04-22 ENCOUNTER — Ambulatory Visit (HOSPITAL_COMMUNITY)
Admission: RE | Admit: 2019-04-22 | Discharge: 2019-04-22 | Disposition: A | Discharge status: Home / Self Care | Payer: Medicaid Other | Source: Ambulatory Visit | Attending: Advanced Practice Midwife | Admitting: Advanced Practice Midwife

## 2019-04-22 ENCOUNTER — Other Ambulatory Visit: Payer: Self-pay

## 2019-04-22 DIAGNOSIS — O26891 Other specified pregnancy related conditions, first trimester: Secondary | ICD-10-CM | POA: Diagnosis present

## 2019-04-22 DIAGNOSIS — Z3491 Encounter for supervision of normal pregnancy, unspecified, first trimester: Secondary | ICD-10-CM | POA: Insufficient documentation

## 2019-04-22 DIAGNOSIS — R109 Unspecified abdominal pain: Secondary | ICD-10-CM | POA: Diagnosis present

## 2019-04-22 DIAGNOSIS — Z712 Person consulting for explanation of examination or test findings: Secondary | ICD-10-CM

## 2019-04-22 NOTE — Telephone Encounter (Signed)
I called [facility] and heard a message " the voice mailbox is full and can't accept messages. Will send mychart message. Per review she has an appointment this afternoon.  Logun Colavito,RN

## 2019-04-22 NOTE — Progress Notes (Signed)
Pt here today for OB US results.  Notified Dr. Harolyn Rutherford pt's results- no new orders.  Pt informed that she has a viable pregnancy with EDD 10/27/19, 13w 1d today.  Medications reconciled.  Pt given [place] pics and genetic testing results with gender.  Pt informed that she is having a girl.  Pt very excited for results.  Pt already scheduled for follow up appts.  Pt did not have any further questions.   Mel Almond, RN 04/22/19

## 2019-04-27 ENCOUNTER — Telehealth (INDEPENDENT_AMBULATORY_CARE_PROVIDER_SITE_OTHER): Payer: Medicaid Other | Admitting: General Practice

## 2019-04-27 ENCOUNTER — Encounter: Payer: Self-pay | Admitting: Advanced Practice Midwife

## 2019-04-27 ENCOUNTER — Encounter: Payer: Self-pay | Admitting: General Practice

## 2019-04-27 DIAGNOSIS — Z148 Genetic carrier of other disease: Secondary | ICD-10-CM

## 2019-04-27 DIAGNOSIS — R898 Other abnormal findings in specimens from other organs, systems and tissues: Secondary | ICD-10-CM

## 2019-04-27 HISTORY — DX: Genetic carrier of other disease: Z14.8

## 2019-04-27 NOTE — Telephone Encounter (Signed)
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 Called to inform patient and discussed results. Provided phone number to [facility] to set up genetic counseling appt. Patient verbalized understanding to all & had no questions.

## 2019-04-30 ENCOUNTER — Encounter: Payer: Self-pay | Admitting: *Deleted

## 2019-05-06 ENCOUNTER — Encounter: Payer: Self-pay | Admitting: *Deleted

## 2019-05-07 ENCOUNTER — Ambulatory Visit (HOSPITAL_COMMUNITY): Payer: Medicaid Other

## 2019-05-08 ENCOUNTER — Telehealth: Payer: Self-pay | Admitting: Obstetrics & Gynecology

## 2019-05-08 NOTE — Telephone Encounter (Signed)
Spoke with patient about her appointment on 9/8 @ 8:30. Patient instructed that the appointment is a mychart visit. Patient instructed to download the mychart app if not already done so. Patient verbalized she has the app downloaded

## 2019-05-12 ENCOUNTER — Telehealth (INDEPENDENT_AMBULATORY_CARE_PROVIDER_SITE_OTHER): Payer: Medicaid Other | Admitting: Obstetrics & Gynecology

## 2019-05-12 ENCOUNTER — Other Ambulatory Visit: Payer: Self-pay

## 2019-05-12 VITALS — BP 138/81 | HR 90

## 2019-05-12 DIAGNOSIS — Z3A16 16 weeks gestation of pregnancy: Secondary | ICD-10-CM

## 2019-05-12 DIAGNOSIS — Z8279 Family history of other congenital malformations, deformations and chromosomal abnormalities: Secondary | ICD-10-CM

## 2019-05-12 DIAGNOSIS — Z3491 Encounter for supervision of normal pregnancy, unspecified, first trimester: Secondary | ICD-10-CM

## 2019-05-12 DIAGNOSIS — D249 Benign neoplasm of unspecified breast: Secondary | ICD-10-CM

## 2019-05-12 DIAGNOSIS — Z148 Genetic carrier of other disease: Secondary | ICD-10-CM

## 2019-05-12 NOTE — Progress Notes (Signed)
[place] VIRTUAL VIDEO VISIT ENCOUNTER NOTE  Provider location: Center for [facility] at [place] connected with [name] on 05/12/19 at  8:30 AM EDT by MyChart Video Encounter at home and verified that I am speaking with the correct person using two identifiers.   I discussed the limitations, risks, security and privacy concerns of performing an evaluation and management service virtually and the availability of in person appointments. I also discussed with the patient that there may be a patient responsible charge related to this service. The patient expressed understanding and agreed to proceed. 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  She is currently monitored for the following issues for this low-risk pregnancy and has Supervision of low-risk pregnancy; Family history of Down syndrome; Breast fibroadenoma; and Genetic carrier status on their problem list.  Patient reports no complaints.  Contractions: Not present. Vag. Bleeding: None.   . Denies any leaking of fluid.   The following portions of the patient's history were reviewed and updated as appropriate: allergies, current medications, past family history, past medical history, past social history, past surgical history and problem list.   Objective:   Vitals:   05/12/19 0831  BP: 138/81  Pulse: 90    Fetal Status:           General:  Alert, oriented and cooperative. Patient is in no acute distress.  Respiratory: Normal respiratory effort, no problems with respiration noted  Mental Status: Normal mood and affect. Normal behavior. Normal judgment and thought content.  Rest of physical exam deferred due to type of encounter  Imaging: US Ob Comp Less 14 Wks  Result Date: 04/22/2019 CLINICAL DATA:  Pelvic pain.  Unsure of dates EXAM: OBSTETRIC <14 WK ULTRASOUND TECHNIQUE: Transvaginal ultrasound was performed for complete  evaluation of the gestation as well as the maternal uterus, adnexal regions, and pelvic cul-de-sac. COMPARISON:  None. FINDINGS: Intrauterine gestational sac: Visualized Yolk sac:  Not visualized Embryo:  Visualized Cardiac Activity:   Visualized Heart Rate: 149 bpm CRL:   69 mm   13 w 1 d                  [place] EDC: October 27, 2019 Subchorionic hemorrhage:  None visualized. Maternal uterus/adnexae: Cervical os is closed. Right ovary measures 4.8 x 1.4 x 2.0 cm. Left ovary measures 3.8 x 1.2 x 2.1 cm. No extrauterine pelvic mass beyond physiologic corpus luteum on the right. No free pelvic fluid. IMPRESSION: Single live intrauterine gestation with estimated gestational age of approximately 30 weeks. No subchorionic hemorrhage. Study otherwise unremarkable. Electronically Signed   By: Lowella Grip III M.D.   On: 04/22/2019 14:38    Assessment and Plan:  Pregnancy: [id number] at [redacted]w[redacted]d 1. Encounter for supervision of low-risk pregnancy in first trimester For anatomy scan 9/30 Need to f/u for AFP will come in tomorrow at 11am    2. Family history of Down syndrome NIPS WNL  3. 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 I sent a note for her to be scheduled with the genetic counselor.       Preterm labor symptoms and general obstetric precautions including but not limited to vaginal bleeding, contractions, leaking of fluid and fetal movement were reviewed in detail with the patient. I discussed the assessment and treatment plan with the patient. The patient was provided an opportunity to ask questions  and all were answered. The patient agreed with the plan and demonstrated an understanding of the instructions. The patient was advised to call back or seek an in-person office evaluation/go to [facility] at [facility] for any urgent or concerning symptoms. Please refer to After Visit Summary for other counseling recommendations.   I provided 11  minutes of face-to-face time during this encounter.  Return in about 4 weeks (around 06/09/2019).  Future Appointments  Date Time Provider [place]  06/03/2019  3:15 PM [place] 4 WH-MFCUS MFC-US  06/09/2019  1:15 PM [place], Hailey L, DO WOC-WOCA WOC    Lavonia Drafts, [place] for [facility], [place]

## 2019-05-12 NOTE — Progress Notes (Signed)
I connected with  Mary Hays on 05/12/19 at  8:30 AM EDT by telephone and verified that I am speaking with the correct person using two identifiers.   I discussed the limitations, risks, security and privacy concerns of performing an evaluation and management service by telephone and the availability of in person appointments. I also discussed with the patient that there may be a patient responsible charge related to this service. The patient expressed understanding and agreed to proceed.    Mary Carmine, RN 05/12/2019  8:29 AM

## 2019-06-01 ENCOUNTER — Other Ambulatory Visit: Payer: Self-pay | Admitting: *Deleted

## 2019-06-01 DIAGNOSIS — Z3490 Encounter for supervision of normal pregnancy, unspecified, unspecified trimester: Secondary | ICD-10-CM

## 2019-06-03 ENCOUNTER — Ambulatory Visit (HOSPITAL_COMMUNITY)
Admission: RE | Admit: 2019-06-03 | Discharge: 2019-06-03 | Disposition: A | Discharge status: Home / Self Care | Payer: Medicaid Other | Source: Ambulatory Visit | Attending: Obstetrics and Gynecology | Admitting: Obstetrics and Gynecology

## 2019-06-03 ENCOUNTER — Other Ambulatory Visit: Payer: Self-pay

## 2019-06-03 ENCOUNTER — Other Ambulatory Visit: Payer: Self-pay | Admitting: Certified Nurse Midwife

## 2019-06-03 ENCOUNTER — Other Ambulatory Visit: Payer: Medicaid Other

## 2019-06-03 DIAGNOSIS — Z363 Encounter for antenatal screening for malformations: Secondary | ICD-10-CM | POA: Diagnosis not present

## 2019-06-03 DIAGNOSIS — R898 Other abnormal findings in specimens from other organs, systems and tissues: Secondary | ICD-10-CM | POA: Diagnosis present

## 2019-06-03 DIAGNOSIS — Z3491 Encounter for supervision of normal pregnancy, unspecified, first trimester: Secondary | ICD-10-CM | POA: Insufficient documentation

## 2019-06-03 DIAGNOSIS — Z3A19 19 weeks gestation of pregnancy: Secondary | ICD-10-CM

## 2019-06-03 DIAGNOSIS — O365920 Maternal care for other known or suspected poor fetal growth, second trimester, not applicable or unspecified: Secondary | ICD-10-CM | POA: Diagnosis not present

## 2019-06-04 ENCOUNTER — Other Ambulatory Visit (HOSPITAL_COMMUNITY): Payer: Self-pay | Admitting: *Deleted

## 2019-06-06 LAB — AFP, SERUM, OPEN SPINA BIFIDA
AFP MoM: 0.6
AFP Value: 37.1 ng/mL
Gest. Age on Collection Date: 20 weeks
Maternal Age At EDD: 28 yr
OSBR Risk 1 IN: 10000
Test Results:: NEGATIVE
Weight: 131 [lb_av]

## 2019-06-08 ENCOUNTER — Other Ambulatory Visit: Payer: Self-pay | Admitting: *Deleted

## 2019-06-08 DIAGNOSIS — Z8279 Family history of other congenital malformations, deformations and chromosomal abnormalities: Secondary | ICD-10-CM

## 2019-06-08 DIAGNOSIS — Z3490 Encounter for supervision of normal pregnancy, unspecified, unspecified trimester: Secondary | ICD-10-CM

## 2019-06-08 DIAGNOSIS — Z148 Genetic carrier of other disease: Secondary | ICD-10-CM

## 2019-06-09 ENCOUNTER — Other Ambulatory Visit: Payer: Self-pay

## 2019-06-09 ENCOUNTER — Other Ambulatory Visit: Payer: Medicaid Other

## 2019-06-09 ENCOUNTER — Ambulatory Visit (INDEPENDENT_AMBULATORY_CARE_PROVIDER_SITE_OTHER): Payer: Medicaid Other | Admitting: Family Medicine

## 2019-06-09 VITALS — BP 97/65 | HR 83 | Temp 98.3°F | Wt 132.7 lb

## 2019-06-09 DIAGNOSIS — Z3492 Encounter for supervision of normal pregnancy, unspecified, second trimester: Secondary | ICD-10-CM

## 2019-06-09 DIAGNOSIS — Z3A20 20 weeks gestation of pregnancy: Secondary | ICD-10-CM

## 2019-06-09 DIAGNOSIS — Z8279 Family history of other congenital malformations, deformations and chromosomal abnormalities: Secondary | ICD-10-CM

## 2019-06-09 DIAGNOSIS — O365920 Maternal care for other known or suspected poor fetal growth, second trimester, not applicable or unspecified: Secondary | ICD-10-CM

## 2019-06-09 DIAGNOSIS — O365990 Maternal care for other known or suspected poor fetal growth, unspecified trimester, not applicable or unspecified: Secondary | ICD-10-CM

## 2019-06-09 DIAGNOSIS — Z148 Genetic carrier of other disease: Secondary | ICD-10-CM

## 2019-06-09 NOTE — Patient Instructions (Signed)
Breastfeeding and Self-Care It is normal to have some problems when you start to breastfeed your new baby. But there are things that you can do to take care of yourself and help prevent many common problems. This includes keeping your breasts healthy and making sure that your baby's mouth attaches (latches) properly to your nipple for feedings. Work with your doctor or breastfeeding specialist (Science writer) to find what works best for you. Follow these instructions at home: Breastfeeding strategy   Always make sure that your baby latches properly to breastfeed.  Make sure that your baby is in a proper position. Try different breastfeeding positions to find one that works best for you and your baby.  Breastfeed when you feel like you need to make your breasts less full or when your baby shows signs of hunger. This is called "breastfeeding on demand."  Do not delay feedings.  Try to relax when it is time to feed your baby. This helps your body release milk from your breast.  To help increase milk flow: ? Remove a small amount of milk from your breast right before breastfeeding. Do this using a pump or by squeezing with your hand. ? Apply warm, moist heat to your breast right before feeding. You can do this in the shower or with hand towels soaked with warm water. ? Massage your breast right before or during feeding. Breast care   To help your breasts stay healthy and keep them from getting too dry: ? Avoid using soap on your nipples. ? Let your nipples air-dry for 3-4 minutes after each feeding. ? Use only cotton bra pads to soak up breast milk that leaks. Be sure to change the pads if they become soaked with milk. If you use bra pads that can be thrown away, change them often. ? Put some lanolin on your nipples after breastfeeding. Pure lanolin does not need to be washed off your nipple before you feed your baby again. Pure lanolin is not harmful to your baby. ? Rub some breast  milk into your nipples: ? Use your hand to squeeze out a few drops of breast milk. ? Gently massage the milk into your nipples. ? Let your nipples air-dry.  Wear a supportive nursing bra. Avoid wearing: ? Tight clothing. ? Underwire bras or bras that put pressure on your breasts.  Use ice to help relieve pain or swelling of your breasts: ? Put ice in a plastic bag. ? Place a towel between your skin and the bag. ? Leave the ice on for 20 minutes, 2-3 times a day. General instructions  Drink enough fluid to keep your pee (urine) pale yellow.  Get plenty of rest. Sleep when your baby sleeps.  Talk to your doctor or breastfeeding specialist before taking any herbal supplements. Contact a health care provider if:  You have nipple pain.  You have cracking or soreness in your nipples that lasts longer than 1 week.  Your breasts are overfilled with milk (engorgement) and this lasts longer than 48 hours.  You have a fever.  You have pus-like fluid coming from your nipple.  You have redness, a rash, swelling, itching, or burning on your breast.  Your baby does not gain weight.  Your baby loses weight. Summary  There are things that you can do to take care of yourself and help prevent many common breastfeeding problems.  Always make sure that your baby's mouth attaches (latches) to your nipple properly to breastfeed.  Keep your nipples  from getting too dry, drink plenty of fluid, and get plenty of rest.  Feed on demand. Do not delay feedings. This information is not intended to replace advice given to you by your health care provider. Make sure you discuss any questions you have with your health care provider. Document Released: 03/27/2017 Document Revised: 12/10/2018 Document Reviewed: 03/27/2017 Elsevier Patient Education  2020 [employer].

## 2019-06-09 NOTE — Progress Notes (Signed)
Subjective:  Mary Hays is a 28 y.o. [id number] at [redacted]w[redacted]d being seen today for ongoing prenatal care.  She is currently monitored for the following issues for this high-risk pregnancy and has Supervision of low-risk pregnancy; Family history of Down syndrome; Breast fibroadenoma; and Genetic carrier status on their problem list.  Patient reports no complaints. Denies leaking of fluid. Baby is moving around a lot.  The following portions of the patient's history were reviewed and updated as appropriate: allergies, current medications, past family history, past medical history, past social history, past surgical history and problem list. Problem list updated.  Objective:   Vitals:   06/09/19 1325  BP: 97/65  Pulse: 83  Temp: 98.3 F (36.8 C)  Weight: 132 lb 11.2 oz (60.2 kg)    Fetal Status: Fetal Heart Rate (bpm): 140   Movement: Present     General:  Alert, oriented and cooperative. Patient is in no acute distress.  Skin: Skin is warm and dry. No rash noted.   Cardiovascular: Normal heart rate noted  Respiratory: Normal respiratory effort, no problems with respiration noted  Abdomen: Soft, gravid, appropriate for gestational age. Pain/Pressure: Present     Pelvic: Vag. Bleeding: None     Cervical exam deferred        Extremities: Normal range of motion.  Edema: None  Mental Status: Normal mood and affect. Normal behavior. Normal judgment and thought content.    Assessment and Plan:  Pregnancy: [id number] at [redacted]w[redacted]d  1. Encounter for supervision of low-risk pregnancy in second trimester  2. Pregnancy affected by fetal growth restriction - [place] on 9/30 shows IUGR 4%; Follow up ordered for growth - AFP negative, TORCH titres drawn today - [place] MFM OB FOLLOW UP; Future  3. 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 Family history of Down syndrome - Had appointment with Alycia Patten on 05/04/19  Preterm labor  symptoms and general obstetric precautions including but not limited to vaginal bleeding, contractions, leaking of fluid and fetal movement were reviewed in detail with the patient. Please refer to After Visit Summary for other counseling recommendations.  Return for Follow up 3 weeks growth sono for IUGR, 4 weeks HOB.   Mary Hays L, DO

## 2019-06-09 NOTE — Addendum Note (Signed)
Addended by: Alric Seton on: 06/09/2019 01:59 PM   Modules accepted: Orders

## 2019-06-11 LAB — RUBELLA SCREEN: Rubella Antibodies, IGG: 4.25 index (ref >0.99)

## 2019-06-11 LAB — HSV 1 ANTIBODY, IGG: HSV 1 Glycoprotein G Ab, IgG: 20.7 index — ABNORMAL HIGH (ref 0.00–0.90)

## 2019-06-11 LAB — HSV 2 ANTIBODY, IGG: HSV 2 IgG, Type Spec: 0.99 index — ABNORMAL HIGH (ref 0.00–0.90)

## 2019-06-11 LAB — RPR: RPR Ser Ql: NONREACTIVE

## 2019-06-11 LAB — CMV ANTIBODY, IGG (EIA): CMV Ab - IgG: 6.8 U/mL — ABNORMAL HIGH (ref 0.00–0.59)

## 2019-06-11 LAB — HSV-2 IGG SUPPLEMENTAL TEST: HSV-2 IgG Supplemental Test: POSITIVE — AB

## 2019-06-11 LAB — TOXOPLASMA GONDII ANTIBODY, IGG: Toxoplasma IgG Ratio: 96.1 IU/mL — ABNORMAL HIGH (ref 0.0–7.1)

## 2019-06-24 ENCOUNTER — Ambulatory Visit (HOSPITAL_COMMUNITY): Payer: Medicaid Other

## 2019-06-24 ENCOUNTER — Other Ambulatory Visit: Payer: Self-pay

## 2019-06-24 ENCOUNTER — Ambulatory Visit (HOSPITAL_COMMUNITY)
Admission: RE | Admit: 2019-06-24 | Discharge: 2019-06-24 | Disposition: A | Discharge status: Home / Self Care | Payer: Medicaid Other | Source: Ambulatory Visit | Attending: Maternal & Fetal Medicine | Admitting: Maternal & Fetal Medicine

## 2019-06-24 ENCOUNTER — Ambulatory Visit (HOSPITAL_COMMUNITY): Payer: Medicaid Other | Admitting: *Deleted

## 2019-06-24 ENCOUNTER — Other Ambulatory Visit (HOSPITAL_COMMUNITY): Payer: Self-pay | Admitting: *Deleted

## 2019-06-24 DIAGNOSIS — Z3A22 22 weeks gestation of pregnancy: Secondary | ICD-10-CM | POA: Diagnosis not present

## 2019-06-24 DIAGNOSIS — Z8279 Family history of other congenital malformations, deformations and chromosomal abnormalities: Secondary | ICD-10-CM | POA: Insufficient documentation

## 2019-06-24 DIAGNOSIS — O365990 Maternal care for other known or suspected poor fetal growth, unspecified trimester, not applicable or unspecified: Secondary | ICD-10-CM | POA: Insufficient documentation

## 2019-06-24 DIAGNOSIS — Z362 Encounter for other antenatal screening follow-up: Secondary | ICD-10-CM | POA: Diagnosis not present

## 2019-06-24 DIAGNOSIS — O365920 Maternal care for other known or suspected poor fetal growth, second trimester, not applicable or unspecified: Secondary | ICD-10-CM | POA: Diagnosis not present

## 2019-06-24 DIAGNOSIS — Z148 Genetic carrier of other disease: Secondary | ICD-10-CM | POA: Diagnosis not present

## 2019-06-25 ENCOUNTER — Other Ambulatory Visit (HOSPITAL_COMMUNITY): Payer: Self-pay | Admitting: *Deleted

## 2019-06-30 LAB — TOXOPLASMA GONDII ANTIBODY, IGM: Toxoplasma Antibody- IgM: 3.2 AU/mL (ref 0.0–7.9)

## 2019-06-30 LAB — CMV IGM: CMV IgM Ser EIA-aCnc: <30 AU/mL (ref 0.0–29.9)

## 2019-06-30 LAB — INFECT DISEASE AB IGM REFLEX 1

## 2019-06-30 LAB — HSV(HERPES SIMPLEX VRS) I + II AB-IGM: HSVI/II Comb IgM: 1.41 Ratio — ABNORMAL HIGH (ref 0.00–0.90)

## 2019-06-30 LAB — CYTOMEGALOVIRUS IGG AVIDITY: CMV IgG Avidity Index: 0.96

## 2019-07-08 ENCOUNTER — Ambulatory Visit (HOSPITAL_COMMUNITY): Payer: Medicaid Other

## 2019-07-08 ENCOUNTER — Encounter (HOSPITAL_COMMUNITY): Payer: Self-pay

## 2019-07-08 ENCOUNTER — Encounter: Payer: Medicaid Other | Admitting: Family Medicine

## 2019-07-09 ENCOUNTER — Other Ambulatory Visit: Payer: Self-pay

## 2019-07-09 ENCOUNTER — Ambulatory Visit (HOSPITAL_COMMUNITY): Payer: Medicaid Other | Admitting: *Deleted

## 2019-07-09 ENCOUNTER — Ambulatory Visit (HOSPITAL_COMMUNITY)
Admission: RE | Admit: 2019-07-09 | Discharge: 2019-07-09 | Disposition: A | Discharge status: Home / Self Care | Payer: Medicaid Other | Source: Ambulatory Visit | Attending: Obstetrics | Admitting: Obstetrics

## 2019-07-09 ENCOUNTER — Encounter (HOSPITAL_COMMUNITY): Payer: Self-pay

## 2019-07-09 DIAGNOSIS — O365990 Maternal care for other known or suspected poor fetal growth, unspecified trimester, not applicable or unspecified: Secondary | ICD-10-CM

## 2019-07-09 DIAGNOSIS — Z148 Genetic carrier of other disease: Secondary | ICD-10-CM | POA: Diagnosis not present

## 2019-07-09 DIAGNOSIS — Z8279 Family history of other congenital malformations, deformations and chromosomal abnormalities: Secondary | ICD-10-CM | POA: Insufficient documentation

## 2019-07-09 DIAGNOSIS — O365920 Maternal care for other known or suspected poor fetal growth, second trimester, not applicable or unspecified: Secondary | ICD-10-CM

## 2019-07-09 DIAGNOSIS — Z3A25 25 weeks gestation of pregnancy: Secondary | ICD-10-CM | POA: Diagnosis not present

## 2019-07-10 ENCOUNTER — Encounter: Payer: Self-pay | Admitting: Family Medicine

## 2019-07-10 NOTE — Progress Notes (Signed)
Patient did not keep appointment today. She will be called to reschedule.  

## 2019-07-15 ENCOUNTER — Other Ambulatory Visit (HOSPITAL_COMMUNITY): Payer: Self-pay | Admitting: *Deleted

## 2019-07-15 ENCOUNTER — Other Ambulatory Visit: Payer: Self-pay

## 2019-07-15 ENCOUNTER — Ambulatory Visit (HOSPITAL_COMMUNITY): Payer: Medicaid Other | Admitting: *Deleted

## 2019-07-15 ENCOUNTER — Encounter (HOSPITAL_COMMUNITY): Payer: Self-pay

## 2019-07-15 ENCOUNTER — Ambulatory Visit (HOSPITAL_COMMUNITY)
Admission: RE | Admit: 2019-07-15 | Discharge: 2019-07-15 | Disposition: A | Discharge status: Home / Self Care | Payer: Medicaid Other | Source: Ambulatory Visit | Attending: Obstetrics and Gynecology | Admitting: Obstetrics and Gynecology

## 2019-07-15 DIAGNOSIS — Z3A25 25 weeks gestation of pregnancy: Secondary | ICD-10-CM

## 2019-07-15 DIAGNOSIS — O365990 Maternal care for other known or suspected poor fetal growth, unspecified trimester, not applicable or unspecified: Secondary | ICD-10-CM

## 2019-07-15 DIAGNOSIS — Z362 Encounter for other antenatal screening follow-up: Secondary | ICD-10-CM

## 2019-07-15 DIAGNOSIS — O365920 Maternal care for other known or suspected poor fetal growth, second trimester, not applicable or unspecified: Secondary | ICD-10-CM | POA: Diagnosis not present

## 2019-07-15 DIAGNOSIS — Z148 Genetic carrier of other disease: Secondary | ICD-10-CM

## 2019-07-15 DIAGNOSIS — Z8279 Family history of other congenital malformations, deformations and chromosomal abnormalities: Secondary | ICD-10-CM | POA: Insufficient documentation

## 2019-07-16 ENCOUNTER — Telehealth (INDEPENDENT_AMBULATORY_CARE_PROVIDER_SITE_OTHER): Payer: Medicaid Other | Admitting: Obstetrics & Gynecology

## 2019-07-16 VITALS — BP 118/67

## 2019-07-16 DIAGNOSIS — B009 Herpesviral infection, unspecified: Secondary | ICD-10-CM

## 2019-07-16 DIAGNOSIS — Z148 Genetic carrier of other disease: Secondary | ICD-10-CM

## 2019-07-16 DIAGNOSIS — Z3A26 26 weeks gestation of pregnancy: Secondary | ICD-10-CM | POA: Diagnosis not present

## 2019-07-16 DIAGNOSIS — O98512 Other viral diseases complicating pregnancy, second trimester: Secondary | ICD-10-CM | POA: Diagnosis not present

## 2019-07-16 DIAGNOSIS — Z1159 Encounter for screening for other viral diseases: Secondary | ICD-10-CM

## 2019-07-16 DIAGNOSIS — Z3492 Encounter for supervision of normal pregnancy, unspecified, second trimester: Secondary | ICD-10-CM

## 2019-07-16 NOTE — Patient Instructions (Signed)

## 2019-07-16 NOTE — Progress Notes (Signed)
   TELEHEALTH VIRTUAL OBSTETRICS VISIT ENCOUNTER NOTE  I connected with Mary Hays on 07/16/19 at 10:35 AM EST by telephone at home and verified that I am speaking with the correct person using two identifiers.   I discussed the limitations, risks, security and privacy concerns of performing an evaluation and management service by telephone and the availability of in person appointments. I also discussed with the patient that there may be a patient responsible charge related to this service. The patient expressed understanding and agreed to proceed.  Subjective:  Mary Hays is a 28 y.o. [id number] at [redacted]w[redacted]d being followed for ongoing prenatal care.  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  Patient reports no complaints. Reports fetal movement. Denies any contractions, bleeding or leaking of fluid.   The following portions of the patient's history were reviewed and updated as appropriate: allergies, current medications, past family history, past medical history, past social history, past surgical history and problem list.   Objective:   General:  Alert, oriented and cooperative.   Mental Status: Normal mood and affect perceived. Normal judgment and thought content.  Rest of physical exam deferred due to type of encounter  Assessment and Plan:  Pregnancy: [id number] at [redacted]w[redacted]d 1. Encounter for supervision of low-risk pregnancy in second trimester F/u US and doppler 2 weeks  2. Genetic carrier status   3. 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  I discussed the assessment and treatment plan with the patient. The patient was provided an opportunity to ask questions and all were answered. The patient agreed with the plan and demonstrated an understanding of the instructions. The patient was advised to call back or seek an in-person office evaluation/go to MAU at [facility] for any urgent or concerning symptoms. Please refer to After Visit Summary for other counseling recommendations.   I provided 12 minutes of non-face-to-face time during this encounter.  Return in about 2 weeks (around 07/30/2019) for 2 hr GTT, repeat HSV test serology.  Future Appointments  Date Time Provider [place]  07/27/2019 10:45 AM [place] 5 WH-MFCUS MFC-US  07/27/2019 11:15 AM WH-MFC NURSE [place] MFC-US  08/12/2019 10:45 AM WH-MFC [place] 2 WH-MFCUS MFC-US  08/12/2019 10:50 AM [place] MFC-US    Emeterio Reeve, [place] [facility] [place], [facility]

## 2019-07-27 ENCOUNTER — Ambulatory Visit (HOSPITAL_COMMUNITY): Payer: Medicaid Other | Admitting: *Deleted

## 2019-07-27 ENCOUNTER — Encounter (HOSPITAL_COMMUNITY): Payer: Self-pay

## 2019-07-27 ENCOUNTER — Ambulatory Visit (HOSPITAL_COMMUNITY)
Admission: RE | Admit: 2019-07-27 | Discharge: 2019-07-27 | Disposition: A | Discharge status: Home / Self Care | Payer: Medicaid Other | Source: Ambulatory Visit | Attending: Obstetrics and Gynecology | Admitting: Obstetrics and Gynecology

## 2019-07-27 ENCOUNTER — Other Ambulatory Visit: Payer: Self-pay

## 2019-07-27 DIAGNOSIS — Z8279 Family history of other congenital malformations, deformations and chromosomal abnormalities: Secondary | ICD-10-CM | POA: Insufficient documentation

## 2019-07-27 DIAGNOSIS — O365920 Maternal care for other known or suspected poor fetal growth, second trimester, not applicable or unspecified: Secondary | ICD-10-CM

## 2019-07-27 DIAGNOSIS — Z3A27 27 weeks gestation of pregnancy: Secondary | ICD-10-CM

## 2019-07-27 DIAGNOSIS — O365990 Maternal care for other known or suspected poor fetal growth, unspecified trimester, not applicable or unspecified: Secondary | ICD-10-CM | POA: Insufficient documentation

## 2019-08-05 ENCOUNTER — Ambulatory Visit (INDEPENDENT_AMBULATORY_CARE_PROVIDER_SITE_OTHER): Payer: Medicaid Other | Admitting: Obstetrics & Gynecology

## 2019-08-05 ENCOUNTER — Other Ambulatory Visit: Payer: Self-pay

## 2019-08-05 ENCOUNTER — Other Ambulatory Visit: Payer: Medicaid Other

## 2019-08-05 VITALS — BP 115/61 | HR 82 | Wt 143.4 lb

## 2019-08-05 DIAGNOSIS — Z3492 Encounter for supervision of normal pregnancy, unspecified, second trimester: Secondary | ICD-10-CM

## 2019-08-05 DIAGNOSIS — Z3A28 28 weeks gestation of pregnancy: Secondary | ICD-10-CM

## 2019-08-05 DIAGNOSIS — Z3493 Encounter for supervision of normal pregnancy, unspecified, third trimester: Secondary | ICD-10-CM

## 2019-08-05 NOTE — Progress Notes (Signed)
   PRENATAL VISIT NOTE  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  Patient reports no complaints.  Contractions: Not present. Vag. Bleeding: None.  Movement: Present. Denies leaking of fluid.   The following portions of the patient's history were reviewed and updated as appropriate: allergies, current medications, past family history, past medical history, past social history, past surgical history and problem list.   Objective:   Vitals:   08/05/19 0849  BP: 115/61  Pulse: 82  Weight: 143 lb 6.4 oz (65 kg)    Fetal Status: Fetal Heart Rate (bpm): 145 Fundal Height: 29 cm Movement: Present     General:  Alert, oriented and cooperative. Patient is in no acute distress.  Skin: Skin is warm and dry. No rash noted.   Cardiovascular: Normal heart rate noted  Respiratory: Normal respiratory effort, no problems with respiration noted  Abdomen: Soft, gravid, appropriate for gestational age.  Pain/Pressure: Absent     Pelvic: Cervical exam deferred        Extremities: Normal range of motion.  Edema: None  Mental Status: Normal mood and affect. Normal behavior. Normal judgment and thought content.   Assessment and Plan:  Pregnancy: [id number] at [redacted]w[redacted]d 1. Encounter for supervision of low-risk pregnancy in third trimester Routine 2 hr GTT  Preterm labor symptoms and general obstetric precautions including but not limited to vaginal bleeding, contractions, leaking of fluid and fetal movement were reviewed in detail with the patient. Please refer to After Visit Summary for other counseling recommendations.   Return in about 3 weeks (around 08/26/2019) for virtual.   Future Appointments  Date Time Provider [place]  08/05/2019  9:30 AM WOC-WOCA LAB WOC-WOCA WOC  08/12/2019 10:45 AM WH-MFC [place] 2 WH-MFCUS MFC-US  08/12/2019 10:50 AM WH-MFC NURSE WH-MFC MFC-US    Emeterio Reeve, MD

## 2019-08-05 NOTE — Patient Instructions (Signed)

## 2019-08-06 LAB — CBC
Hematocrit: 31.6 % — ABNORMAL LOW (ref 34.0–46.6)
Hemoglobin: 9.9 g/dL — ABNORMAL LOW (ref 11.1–15.9)
MCH: 25.1 pg — ABNORMAL LOW (ref 26.6–33.0)
MCHC: 31.3 g/dL — ABNORMAL LOW (ref 31.5–35.7)
MCV: 80 fL (ref 79–97)
Platelets: 230 10*3/uL (ref 150–450)
RBC: 3.94 x10E6/uL (ref 3.77–5.28)
RDW: 13.3 % (ref 11.7–15.4)
WBC: 13.7 10*3/uL — ABNORMAL HIGH (ref 3.4–10.8)

## 2019-08-06 LAB — RPR: RPR Ser Ql: NONREACTIVE

## 2019-08-06 LAB — GLUCOSE TOLERANCE, 2 HOURS W/ 1HR
Glucose, 1 hour: 171 mg/dL (ref 65–179)
Glucose, 2 hour: 127 mg/dL (ref 65–152)
Glucose, Fasting: 84 mg/dL (ref 65–91)

## 2019-08-06 LAB — HIV ANTIBODY (ROUTINE TESTING W REFLEX): HIV Screen 4th Generation wRfx: NONREACTIVE

## 2019-08-12 ENCOUNTER — Other Ambulatory Visit: Payer: Self-pay | Admitting: Obstetrics & Gynecology

## 2019-08-12 ENCOUNTER — Encounter (HOSPITAL_COMMUNITY): Payer: Self-pay

## 2019-08-12 ENCOUNTER — Ambulatory Visit (HOSPITAL_COMMUNITY): Payer: Medicaid Other | Admitting: *Deleted

## 2019-08-12 ENCOUNTER — Other Ambulatory Visit: Payer: Self-pay

## 2019-08-12 ENCOUNTER — Ambulatory Visit (HOSPITAL_COMMUNITY)
Admission: RE | Admit: 2019-08-12 | Discharge: 2019-08-12 | Disposition: A | Discharge status: Home / Self Care | Payer: Medicaid Other | Source: Ambulatory Visit | Attending: Obstetrics and Gynecology | Admitting: Obstetrics and Gynecology

## 2019-08-12 ENCOUNTER — Other Ambulatory Visit (HOSPITAL_COMMUNITY): Payer: Self-pay | Admitting: *Deleted

## 2019-08-12 DIAGNOSIS — O365990 Maternal care for other known or suspected poor fetal growth, unspecified trimester, not applicable or unspecified: Secondary | ICD-10-CM

## 2019-08-12 DIAGNOSIS — O365930 Maternal care for other known or suspected poor fetal growth, third trimester, not applicable or unspecified: Secondary | ICD-10-CM

## 2019-08-12 DIAGNOSIS — Z3A29 29 weeks gestation of pregnancy: Secondary | ICD-10-CM

## 2019-08-12 DIAGNOSIS — Z148 Genetic carrier of other disease: Secondary | ICD-10-CM | POA: Diagnosis not present

## 2019-08-12 DIAGNOSIS — Z8279 Family history of other congenital malformations, deformations and chromosomal abnormalities: Secondary | ICD-10-CM | POA: Insufficient documentation

## 2019-08-12 DIAGNOSIS — Z3492 Encounter for supervision of normal pregnancy, unspecified, second trimester: Secondary | ICD-10-CM

## 2019-08-12 MED ORDER — FERROUS SULFATE 325 (65 FE) MG PO TABS: 325.0000 mg | ORAL_TABLET | Freq: Every day | ORAL | 3 refills | Status: DC

## 2019-08-24 ENCOUNTER — Telehealth (INDEPENDENT_AMBULATORY_CARE_PROVIDER_SITE_OTHER): Payer: Medicaid Other | Admitting: Obstetrics and Gynecology

## 2019-08-24 VITALS — BP 122/73 | HR 89 | Wt 149.0 lb

## 2019-08-24 DIAGNOSIS — Z87898 Personal history of other specified conditions: Secondary | ICD-10-CM | POA: Insufficient documentation

## 2019-08-24 DIAGNOSIS — Z148 Genetic carrier of other disease: Secondary | ICD-10-CM

## 2019-08-24 DIAGNOSIS — Z3A31 31 weeks gestation of pregnancy: Secondary | ICD-10-CM

## 2019-08-24 DIAGNOSIS — R898 Other abnormal findings in specimens from other organs, systems and tissues: Secondary | ICD-10-CM | POA: Insufficient documentation

## 2019-08-24 DIAGNOSIS — Z8279 Family history of other congenital malformations, deformations and chromosomal abnormalities: Secondary | ICD-10-CM

## 2019-08-24 DIAGNOSIS — O0990 Supervision of high risk pregnancy, unspecified, unspecified trimester: Secondary | ICD-10-CM

## 2019-08-24 DIAGNOSIS — O0993 Supervision of high risk pregnancy, unspecified, third trimester: Secondary | ICD-10-CM | POA: Diagnosis not present

## 2019-08-24 HISTORY — DX: Personal history of other specified conditions: Z87.898

## 2019-08-24 NOTE — Progress Notes (Signed)
   TELEHEALTH VIRTUAL OBSTETRICS VISIT ENCOUNTER NOTE  Clinic: [facility]  I connected with Yamika Cifarelli on 08/24/19 at  9:15 AM EST by telephone at home and verified that I am speaking with the correct person using two identifiers.   I discussed the limitations, risks, security and privacy concerns of performing an evaluation and management service by telephone and the availability of in person appointments. I also discussed with the patient that there may be a patient responsible charge related to this service. The patient expressed understanding and agreed to proceed.  Subjective:  Dorothia Bina is a 28 y.o. 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  Patient reports no complaints. Reports fetal movement. Denies any contractions, bleeding or leaking of fluid.   The following portions of the patient's history were reviewed and updated as appropriate: allergies, current medications, past family history, past medical history, past social history, past surgical history and problem list.   Objective:   Vitals:   08/24/19 0845  BP: 122/73  Pulse: 89  Weight: 149 lb (67.6 kg)    Babyscripts Data Reviewed: not applicable  General:  Alert, oriented and cooperative.   Mental Status: Normal mood and affect perceived. Normal judgment and thought content.  Rest of physical exam deferred due to type of encounter  Assessment and Plan:  Pregnancy: [id number] at [redacted]w[redacted]d 1. Supervision of high risk pregnancy, antepartum Routine care Ask about BC nv  2. Matenal carrier for Medium  Chain Acyl-CoA Dehydrogenase deficiency Let peds know about delivery  3. H/o FGR Has rpt u/s mid january  Preterm labor symptoms and general obstetric precautions including but not limited to vaginal bleeding, contractions, leaking of fluid and fetal movement were reviewed in detail with the patient.  I discussed the assessment and treatment plan with the patient. The patient was provided an opportunity to ask questions and all were answered. The patient agreed with the plan and demonstrated an understanding of the instructions. The patient was advised to call back or seek an in-person office evaluation/go to MAU at [facility] for any urgent or concerning symptoms. Please refer to After Visit Summary for other counseling recommendations.   I provided 7 minutes of non-face-to-face time during this encounter. The visit was conducted via MyChart-medicine  Return in about 2 weeks (around 09/07/2019) for high risk, in person or virtual.  Future Appointments  Date Time Provider [place]  08/24/2019  9:15 AM Aletha Halim, MD WOC-WOCA [place]  09/16/2019 10:30 AM WH-MFC [place] 1 WH-MFCUS MFC-US  09/16/2019 10:40 AM WH-MFC NURSE WH-MFC MFC-US    Aletha Halim, MD [facility] for [employer] [facility], [place]

## 2019-08-24 NOTE — Progress Notes (Signed)
I connected with  Mary Hays on 08/24/19 at  9:15 AM EST by telephone and verified that I am speaking with the correct person using two identifiers.   I discussed the limitations, risks, security and privacy concerns of performing an evaluation and management service by telephone and the availability of in person appointments. I also discussed with the patient that there may be a patient responsible charge related to this service. The patient expressed understanding and agreed to proceed.  Mary Carmine, RN 08/24/2019  8:44 AM

## 2019-09-04 NOTE — L&D Delivery Note (Signed)
   Delivery Note At 10:21 PM a viable female was delivered via Vaginal, Spontaneous (Presentation: Right Occiput Anterior).  APGAR: 9, 9; weight .   Placenta status: Spontaneous, Intact.  Cord: 3 vessels with the following complications: None.After 1 minute, the cord was clamped and cut. 40 units of pitocin diluted in 1000cc LR was infused rapidly IV.  The placenta separated spontaneously and delivered via CCT and maternal pushing effort.  It was inspected and appears to be intact with a 3 VC.     Anesthesia: Local Episiotomy: None Lacerations: 1st degree;Perineal Suture Repair: 3.0 vicryl Est. Blood Loss (mL): 308  Mom to postpartum.  Baby to Couplet care / Skin to Skin.  Christin Fudge 10/15/2019, 10:58 PM

## 2019-09-08 ENCOUNTER — Telehealth (INDEPENDENT_AMBULATORY_CARE_PROVIDER_SITE_OTHER): Payer: Medicaid Other | Admitting: Family Medicine

## 2019-09-08 VITALS — BP 118/67 | HR 90

## 2019-09-08 DIAGNOSIS — O0993 Supervision of high risk pregnancy, unspecified, third trimester: Secondary | ICD-10-CM | POA: Diagnosis not present

## 2019-09-08 DIAGNOSIS — B009 Herpesviral infection, unspecified: Secondary | ICD-10-CM

## 2019-09-08 DIAGNOSIS — Z3A33 33 weeks gestation of pregnancy: Secondary | ICD-10-CM

## 2019-09-08 DIAGNOSIS — Z87898 Personal history of other specified conditions: Secondary | ICD-10-CM

## 2019-09-08 DIAGNOSIS — Z8279 Family history of other congenital malformations, deformations and chromosomal abnormalities: Secondary | ICD-10-CM

## 2019-09-08 DIAGNOSIS — R898 Other abnormal findings in specimens from other organs, systems and tissues: Secondary | ICD-10-CM

## 2019-09-08 DIAGNOSIS — O0990 Supervision of high risk pregnancy, unspecified, unspecified trimester: Secondary | ICD-10-CM

## 2019-09-08 NOTE — Progress Notes (Signed)
   Subjective:  Mary Hays is a 29 y.o. 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  Patient reports no bleeding, no cramping and no leaking.  Contractions: Not present. Vag. Bleeding: None.  Movement: Present. Denies leaking of fluid.   The following portions of the patient's history were reviewed and updated as appropriate: allergies, current medications, past family history, past medical history, past social history, past surgical history and problem list. Problem list updated.  Objective:   Vitals:   09/08/19 0849  BP: 118/67  Pulse: 90    Fetal Status:     Movement: Present     General:  Alert, oriented and cooperative. Patient is in no acute distress.  Skin: Skin is warm and dry. No rash noted.   Cardiovascular: Normal heart rate noted  Respiratory: Normal respiratory effort, no problems with respiration noted  Abdomen: Soft, gravid, appropriate for gestational age. Pain/Pressure: Present     Pelvic: Vag. Bleeding: None     Cervical exam deferred        Extremities: Normal range of motion.  Edema: None  Mental Status: Normal mood and affect. Normal behavior. Normal judgment and thought content.   Urinalysis:      Assessment and Plan:  Pregnancy: [id number] at [redacted]w[redacted]d  1. Supervision of high risk pregnancy, antepartum Reports possible COVID symptoms for one week: headache, no smell or taste, mild cough starting today, no fevers, no known contacts. Has video appt with doctor today to decide on testing. Appears well on video chat, denies chest pain or SOB.  Strongly encouraged to get  tested given effect will have on L&D if she is positive now but not tested until arrival RTC in 2 weeks for swabs pending result of likely COVID testing BP normal Reviewed warning signs  2. Family history of Down syndrome   3. History of poor fetal growth Resolved on most recent scan, has [place] appt for next week  4. Matenal carrier for Medium Chain Acyl-CoA Dehydrogenase deficiency Reports husband did not get testing, they do not intend to  5. 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 Please refer to After Visit Summary for other counseling recommendations.  Return in 2 weeks (on 09/22/2019) for in person PENDING COVID TESTING, HRC, ob visit.   Clarnce Flock, MD

## 2019-09-08 NOTE — Progress Notes (Signed)
I connected with  Mary Hays on 09/08/19 at  9:15 AM EST by telephone and verified that I am speaking with the correct person using two identifiers.   I discussed the limitations, risks, security and privacy concerns of performing an evaluation and management service by telephone and the availability of in person appointments. I also discussed with the patient that there may be a patient responsible charge related to this service. The patient expressed understanding and agreed to proceed.  Annabell Howells, RN 09/08/2019  8:48 AM

## 2019-09-08 NOTE — Patient Instructions (Signed)

## 2019-09-10 ENCOUNTER — Emergency Department (HOSPITAL_COMMUNITY)
Admission: EM | Admit: 2019-09-10 | Discharge: 2019-09-10 | Disposition: A | Discharge status: Home / Self Care | Payer: Medicaid Other | Attending: Emergency Medicine | Admitting: Emergency Medicine

## 2019-09-10 ENCOUNTER — Encounter (HOSPITAL_COMMUNITY): Payer: Self-pay | Admitting: Emergency Medicine

## 2019-09-10 ENCOUNTER — Telehealth: Payer: Self-pay

## 2019-09-10 DIAGNOSIS — R0602 Shortness of breath: Secondary | ICD-10-CM

## 2019-09-10 DIAGNOSIS — Z3A35 35 weeks gestation of pregnancy: Secondary | ICD-10-CM | POA: Diagnosis not present

## 2019-09-10 DIAGNOSIS — U071 COVID-19: Secondary | ICD-10-CM | POA: Insufficient documentation

## 2019-09-10 DIAGNOSIS — R0781 Pleurodynia: Secondary | ICD-10-CM | POA: Insufficient documentation

## 2019-09-10 DIAGNOSIS — O99519 Diseases of the respiratory system complicating pregnancy, unspecified trimester: Secondary | ICD-10-CM | POA: Insufficient documentation

## 2019-09-10 DIAGNOSIS — Z79899 Other long term (current) drug therapy: Secondary | ICD-10-CM | POA: Insufficient documentation

## 2019-09-10 LAB — CBC WITH DIFFERENTIAL/PLATELET
Abs Immature Granulocytes: 0.54 10*3/uL — ABNORMAL HIGH (ref 0.00–0.07)
Basophils Absolute: 0 10*3/uL (ref 0.0–0.1)
Basophils Relative: 0 %
Eosinophils Absolute: 0.1 10*3/uL (ref 0.0–0.5)
Eosinophils Relative: 1 %
HCT: 31.6 % — ABNORMAL LOW (ref 36.0–46.0)
Hemoglobin: 9.7 g/dL — ABNORMAL LOW (ref 12.0–15.0)
Immature Granulocytes: 4 %
Lymphocytes Relative: 20 %
Lymphs Abs: 2.9 10*3/uL (ref 0.7–4.0)
MCH: 23 pg — ABNORMAL LOW (ref 26.0–34.0)
MCHC: 30.7 g/dL (ref 30.0–36.0)
MCV: 75.1 fL — ABNORMAL LOW (ref 80.0–100.0)
Monocytes Absolute: 0.9 10*3/uL (ref 0.1–1.0)
Monocytes Relative: 6 %
Neutro Abs: 10.1 10*3/uL — ABNORMAL HIGH (ref 1.7–7.7)
Neutrophils Relative %: 69 %
Platelets: 269 10*3/uL (ref 150–400)
RBC: 4.21 MIL/uL (ref 3.87–5.11)
RDW: 15.1 % (ref 11.5–15.5)
WBC: 14.6 10*3/uL — ABNORMAL HIGH (ref 4.0–10.5)
nRBC: 0 % (ref 0.0–0.2)

## 2019-09-10 LAB — COMPREHENSIVE METABOLIC PANEL
ALT: 14 U/L (ref 0–44)
AST: 17 U/L (ref 15–41)
Albumin: 2.8 g/dL — ABNORMAL LOW (ref 3.5–5.0)
Alkaline Phosphatase: 76 U/L (ref 38–126)
Anion gap: 9 (ref 5–15)
BUN: <5 mg/dL — ABNORMAL LOW (ref 6–20)
CO2: 21 mmol/L — ABNORMAL LOW (ref 22–32)
Calcium: 8.9 mg/dL (ref 8.9–10.3)
Chloride: 106 mmol/L (ref 98–111)
Creatinine, Ser: 0.39 mg/dL — ABNORMAL LOW (ref 0.44–1.00)
GFR calc Af Amer: >60 mL/min (ref >60)
GFR calc non Af Amer: >60 mL/min (ref >60)
Glucose, Bld: 89 mg/dL (ref 70–99)
Potassium: 3.5 mmol/L (ref 3.5–5.1)
Sodium: 136 mmol/L (ref 135–145)
Total Bilirubin: 0.4 mg/dL (ref 0.3–1.2)
Total Protein: 5.9 g/dL — ABNORMAL LOW (ref 6.5–8.1)

## 2019-09-10 LAB — D-DIMER, QUANTITATIVE: D-Dimer, Quant: 0.59 ug/mL-FEU — ABNORMAL HIGH (ref 0.00–0.50)

## 2019-09-10 NOTE — Telephone Encounter (Signed)
TC to [facility] in Error from a PA from [facility] regarding patient recent + COVID test Message stated they advised pt to go to ER.  Provider requested a call back [phone]

## 2019-09-10 NOTE — ED Provider Notes (Signed)
[facility] EMERGENCY DEPARTMENT Provider Note   CSN: [id number] Arrival date & time: 09/10/19  1722     History Chief Complaint  Patient presents with  . covid + 8 months preg     Mary Hays is a 29 y.o. female.  HPI Mary Hays is a 29 y.o. female with a medical history of hsv who presents to the ED for shortness of breath and chest pain.  She reports is [redacted] weeks pregnant.  G4 P2 A1.  She states 2 days ago tested positive for Covid, today she has worsening shortness of breath and left-sided pleuritic chest pain.  Chest discomfort is nonradiating, 4 out of 10.  She denies fever, vomiting, nausea, abdominal pain.  She spoke with her PCP over the phone today who instructed her to come to the ED.      Past Medical History:  Diagnosis Date  . Medical history non-contributory     Patient Active Problem List   Diagnosis Date Noted  . History of poor fetal growth 08/24/2019  . 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 Genetic carrier status 04/27/2019  . Supervision of high risk pregnancy, antepartum 03/30/2019  . Family history of Down syndrome 03/30/2019  . 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 APPENDECTOMY       OB History    Gravida  4   Para  2   Term  2   Preterm  0   AB  1   Living  2     SAB  1   TAB  0   Ectopic  0   Multiple  0   Live Births  2           Family History  Problem Relation Age of Onset  . Diabetes Father   . Intellectual disability Sister     Social History   Tobacco Use  . Smoking status: Never Smoker  . Smokeless tobacco: Never Used  Substance Use Topics  . Alcohol use: No  . Drug use: No    Home Medications Prior to Admission medications   Medication Sig Start Date End Date Taking? Authorizing Provider  acetaminophen (TYLENOL) 325 MG tablet  Take 650 mg by mouth every 6 (six) hours as needed.    [provider]  Ascorbic Acid (VITAMIN C PO) Take by mouth.    [provider]  ferrous sulfate 325 (65 FE) MG tablet Take 1 tablet (325 mg total) by mouth daily with breakfast. Patient not taking: Reported on 08/24/2019 08/12/19   Woodroe Mode, MD  Prenatal Vit-Fe Fumarate-FA (PRENATAL MULTIVITAMIN) TABS tablet Take 1 tablet by mouth daily.     [provider]    Allergies    Patient has no known allergies.  Review of Systems   Review of Systems  Constitutional: Negative for chills and fever.  HENT: Negative for ear pain and sore throat.   Eyes: Negative for pain and visual disturbance.  Respiratory: Positive for shortness of breath. Negative for cough.   Cardiovascular: Positive for chest pain. Negative for palpitations.  Gastrointestinal: Negative for abdominal pain and vomiting.  Genitourinary: Negative for dysuria and hematuria.  Musculoskeletal: Negative for arthralgias and back pain.  Skin: Negative for color change and rash.  Neurological: Negative for seizures and syncope.  All other systems reviewed and are negative.   Physical  Exam Updated Vital Signs BP 92/69 (BP Location: Left Arm)   Pulse 82   Temp 98.2 F (36.8 C) (Oral)   Resp 16   LMP 01/15/2019 (Exact Date)   SpO2 98%   Physical Exam Vitals and nursing note reviewed.  Constitutional:      General: She is not in acute distress.    Appearance: Normal appearance. She is well-developed. She is not ill-appearing.  HENT:     Head: Normocephalic and atraumatic.     Right Ear: External ear normal.     Left Ear: External ear normal.     Nose: Nose normal. No rhinorrhea.     Mouth/Throat:     Mouth: Mucous membranes are moist.  Eyes:     General:        Right eye: No discharge.        Left eye: No discharge.     Conjunctiva/sclera: Conjunctivae normal.  Cardiovascular:     Rate and Rhythm: Normal rate and regular rhythm.       Pulses: Normal pulses.     Heart sounds: Normal heart sounds. No murmur.  Pulmonary:     Effort: Pulmonary effort is normal. No respiratory distress.     Breath sounds: Normal breath sounds. No wheezing or rales.  Abdominal:     General: Abdomen is flat. There is no distension.     Palpations: Abdomen is soft.     Tenderness: There is no abdominal tenderness.     Comments: Fundus palpable between xiphoid and umbilicus. Abdomen is non tender  Musculoskeletal:        General: No deformity or signs of injury. Normal range of motion.     Cervical back: Normal range of motion and neck supple.  Skin:    General: Skin is warm and dry.     Capillary Refill: Capillary refill takes less than 2 seconds.     Coloration: Skin is not jaundiced.  Neurological:     General: No focal deficit present.     Mental Status: She is alert. Mental status is at baseline.  Psychiatric:        Mood and Affect: Mood normal.        Behavior: Behavior normal.     ED Results / Procedures / Treatments   Labs (all labs ordered are listed, but only abnormal results are displayed) Labs Reviewed  COMPREHENSIVE METABOLIC PANEL - Abnormal; Notable for the following components:      Result Value   CO2 21 (*)    BUN <5 (*)    Creatinine, Ser 0.39 (*)    Total Protein 5.9 (*)    Albumin 2.8 (*)    All other components within normal limits  CBC WITH DIFFERENTIAL/PLATELET - Abnormal; Notable for the following components:   WBC 14.6 (*)    Hemoglobin 9.7 (*)    HCT 31.6 (*)    MCV 75.1 (*)    MCH 23.0 (*)    Neutro Abs 10.1 (*)    Abs Immature Granulocytes 0.54 (*)    All other components within normal limits  D-DIMER, QUANTITATIVE (NOT AT [facility]) - Abnormal; Notable for the following components:   D-Dimer, Quant 0.59 (*)    All other components within normal limits    EKG EKG Interpretation  Date/Time:  Thursday September 10 2019 19:41:07 EST Ventricular Rate:  79 PR Interval:    QRS Duration: 87 QT  Interval:  374 QTC Calculation: 429 R Axis:   87 Text Interpretation:  Sinus rhythm T wave abnormality Artifact Abnormal ECG Confirmed by Carmin Muskrat [phone]) on 09/10/2019 8:16:09 PM Also confirmed by Carmin Muskrat ([id number]), editor Gean Quint [phone])  on 09/11/2019 8:49:51 AM   Radiology No results found.  Procedures Procedures (including critical care time)  Medications Ordered in ED Medications - No data to display  ED Course  I have reviewed the triage vital signs and the nursing notes.  Pertinent labs & imaging results that were available during my care of the patient were reviewed by me and considered in my medical decision making (see chart for details).    MDM Rules/Calculators/A&P                      Mary Hays is a 29 y.o. female with a medical history of G4P2A1 hsv who presents to the ED for shortness of breath and chest pain.  She reports is [redacted] weeks pregnant. She states 2 days ago tested positive for Covid, today she has worsening shortness of breath and left-sided pleuritic chest pain.  Chest discomfort is nonradiating, 4 out of 10.  She denies fever, vomiting, nausea, abdominal pain.  She spoke with her PCP over the phone today who instructed her to come to the ED.  HPI and physical exam as above. She presents awake, alert, hemodynamically stable, afebrile, non toxic.  No respiratory distress, well-appearing. Fundus palpable between xiphoid and umbilicus. Abdomen is non tender.  She is comfortable on room air without respiratory distress. We decided to work her up for pe with pleuritic chest pain, shortness of breath, pregnant and now with covid. Her d-dimer was adjusted for pregnancy and within normal range. We have a low suspicion for pulmonary embolism, acs, pneumonia or pneumothorax. Her symptoms are likely due to covid. We discussed follow up with ob/gyn and her family doctor. Discussed plan with patient and family including strict return precautions and follow  up with PCP. Patient and family understand and are amenable with plan.   Final Clinical Impression(s) / ED Diagnoses Final diagnoses:  Shortness of breath    Rx / DC Orders ED Discharge Orders    None       Aili Casillas, Lovena Le, MD 09/11/19 Leonides Schanz    Carmin Muskrat, MD 09/12/19 [phone]

## 2019-09-10 NOTE — ED Triage Notes (Signed)
Pt reports testing positive for covid a few days ago and has been having shortness of breath with a cough and generalized body pain. Pt is pregnant with due date 10/22/19

## 2019-09-10 NOTE — ED Notes (Signed)
Patient verbalizes understanding of discharge instructions. Opportunity for questioning and answers were provided. Armband removed by staff, pt discharged from ED.  

## 2019-09-10 NOTE — ED Notes (Signed)
Called [facility] to let them know pt is here. They said it was appropriate for her to be in the ED and that we could call [facility] if needed. [id number]

## 2019-09-16 ENCOUNTER — Ambulatory Visit (HOSPITAL_COMMUNITY): Payer: Medicaid Other

## 2019-09-16 ENCOUNTER — Ambulatory Visit (HOSPITAL_COMMUNITY): Admission: RE | Admit: 2019-09-16 | Payer: Medicaid Other | Source: Ambulatory Visit

## 2019-09-23 ENCOUNTER — Ambulatory Visit (HOSPITAL_COMMUNITY): Payer: Medicaid Other | Admitting: *Deleted

## 2019-09-23 ENCOUNTER — Encounter (HOSPITAL_COMMUNITY): Payer: Self-pay

## 2019-09-23 ENCOUNTER — Other Ambulatory Visit: Payer: Self-pay

## 2019-09-23 ENCOUNTER — Ambulatory Visit (HOSPITAL_COMMUNITY)
Admission: RE | Admit: 2019-09-23 | Discharge: 2019-09-23 | Disposition: A | Discharge status: Home / Self Care | Payer: Medicaid Other | Source: Ambulatory Visit | Attending: Obstetrics | Admitting: Obstetrics

## 2019-09-23 DIAGNOSIS — O0990 Supervision of high risk pregnancy, unspecified, unspecified trimester: Secondary | ICD-10-CM | POA: Diagnosis present

## 2019-09-23 DIAGNOSIS — Z3A35 35 weeks gestation of pregnancy: Secondary | ICD-10-CM | POA: Diagnosis not present

## 2019-09-23 DIAGNOSIS — O365930 Maternal care for other known or suspected poor fetal growth, third trimester, not applicable or unspecified: Secondary | ICD-10-CM

## 2019-09-23 DIAGNOSIS — O403XX0 Polyhydramnios, third trimester, not applicable or unspecified: Secondary | ICD-10-CM | POA: Diagnosis not present

## 2019-09-23 DIAGNOSIS — Z8279 Family history of other congenital malformations, deformations and chromosomal abnormalities: Secondary | ICD-10-CM | POA: Insufficient documentation

## 2019-09-23 DIAGNOSIS — O365990 Maternal care for other known or suspected poor fetal growth, unspecified trimester, not applicable or unspecified: Secondary | ICD-10-CM | POA: Diagnosis present

## 2019-09-23 DIAGNOSIS — Z362 Encounter for other antenatal screening follow-up: Secondary | ICD-10-CM | POA: Diagnosis not present

## 2019-09-25 ENCOUNTER — Other Ambulatory Visit (HOSPITAL_COMMUNITY)
Admission: RE | Admit: 2019-09-25 | Discharge: 2019-09-25 | Disposition: A | Discharge status: Home / Self Care | Payer: Medicaid Other | Source: Ambulatory Visit | Attending: Obstetrics and Gynecology | Admitting: Obstetrics and Gynecology

## 2019-09-25 ENCOUNTER — Encounter: Payer: Self-pay | Admitting: Obstetrics and Gynecology

## 2019-09-25 ENCOUNTER — Ambulatory Visit (INDEPENDENT_AMBULATORY_CARE_PROVIDER_SITE_OTHER): Payer: Medicaid Other | Admitting: Obstetrics and Gynecology

## 2019-09-25 ENCOUNTER — Other Ambulatory Visit: Payer: Self-pay

## 2019-09-25 VITALS — BP 112/55 | HR 92 | Wt 152.3 lb

## 2019-09-25 DIAGNOSIS — Z3A36 36 weeks gestation of pregnancy: Secondary | ICD-10-CM

## 2019-09-25 DIAGNOSIS — O0990 Supervision of high risk pregnancy, unspecified, unspecified trimester: Secondary | ICD-10-CM

## 2019-09-25 DIAGNOSIS — Z8616 Personal history of COVID-19: Secondary | ICD-10-CM

## 2019-09-25 DIAGNOSIS — O0993 Supervision of high risk pregnancy, unspecified, third trimester: Secondary | ICD-10-CM

## 2019-09-25 DIAGNOSIS — B009 Herpesviral infection, unspecified: Secondary | ICD-10-CM

## 2019-09-25 MED ORDER — CYCLOBENZAPRINE HCL 10 MG PO TABS: 10.0000 mg | ORAL_TABLET | Freq: Three times a day (TID) | ORAL | 1 refills | Status: DC | PRN

## 2019-09-25 NOTE — Progress Notes (Signed)
Prenatal Visit Note Date: 09/25/2019 Clinic: [facility]  Subjective:  Mary Hays is a 29 y.o. [id number] at [redacted]w[redacted]d being seen today for ongoing prenatal care.  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  Patient reports continued sciatic pain particularly on the right side.   Contractions: Not present. Vag. Bleeding: None.  Movement: Present. Denies leaking of fluid.   The following portions of the patient's history were reviewed and updated as appropriate: allergies, current medications, past family history, past medical history, past social history, past surgical history and problem list. Problem list updated.  Objective:   Vitals:   09/25/19 1026  BP: (!) 112/55  Pulse: 92  Weight: 152 lb 4.8 oz (69.1 kg)    Fetal Status: Fetal Heart Rate (bpm): 132   Movement: Present     General:  Alert, oriented and cooperative. Patient is in no acute distress.  Skin: Skin is warm and dry. No rash noted.   Cardiovascular: Normal heart rate noted  Respiratory: Normal respiratory effort, no problems with respiration noted  Abdomen: Soft, gravid, appropriate for gestational age. Pain/Pressure: Present     Pelvic:  Cervical exam performed        Extremities: Normal range of motion.  Edema: None  Mental Status: Normal mood and affect. Normal behavior. Normal judgment and thought content.   Urinalysis:      Assessment and Plan:  Pregnancy: [id number] at [redacted]w[redacted]d  1. Supervision of high risk pregnancy, antepartum Routine care Flexeril prn. Okay for bath soaks, prenatal massages. Will set up for 39wk IOL given borderline  polyhydraminos as suggested by MFM +HSV on blood serum only Borderline poly at last u/s. No follow up recommended.  - Culture, beta strep (group b only) - GC/Chlamydia probe amp ([place])not at [facility]  2. History of COVID-19 No sequelae   Preterm labor symptoms and general obstetric precautions including but not limited to vaginal bleeding, contractions, leaking of fluid and fetal movement were reviewed in detail with the patient. Please refer to After Visit Summary for other counseling recommendations.  No follow-ups on file.   Aletha Halim, MD

## 2019-09-28 ENCOUNTER — Telehealth: Payer: Self-pay

## 2019-09-28 ENCOUNTER — Encounter: Payer: Self-pay | Admitting: Obstetrics and Gynecology

## 2019-09-28 DIAGNOSIS — O409XX0 Polyhydramnios, unspecified trimester, not applicable or unspecified: Secondary | ICD-10-CM | POA: Insufficient documentation

## 2019-09-28 LAB — GC/CHLAMYDIA PROBE AMP (~~LOC~~) NOT AT ARMC
Chlamydia: NEGATIVE
Comment: NEGATIVE
Comment: NORMAL
Neisseria Gonorrhea: NEGATIVE

## 2019-09-28 LAB — CULTURE, BETA STREP (GROUP B ONLY): Strep Gp B Culture: NEGATIVE

## 2019-09-28 NOTE — Telephone Encounter (Signed)
Called pt to advise IOL scheduled for 10/15/19 in the morning & that they will call her with specific time.Also advised her to ignore info reg IOL in My Chart. Pt states that she wants to wait to talk with a Dr. Before she starts Valtrex, her next appointment is 10/01/19 with Dr. Rip Harbour. Pt verbalized understanding.

## 2019-09-28 NOTE — Telephone Encounter (Signed)
-----   Message from Aletha Halim, MD sent at 09/28/2019  9:50 AM EST ----- Regarding: set up for 2/10 at night or any time 2/11 IOL for polyhydramnios. 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 I forgot to offer her valtrex prophylaxis but can y'all offer it to her when you call her and if she wants it, send in valtrex 500mg  po bid # 30. Thanks!  She speaks english

## 2019-10-01 ENCOUNTER — Encounter: Payer: Self-pay | Admitting: Obstetrics and Gynecology

## 2019-10-01 ENCOUNTER — Telehealth (INDEPENDENT_AMBULATORY_CARE_PROVIDER_SITE_OTHER): Payer: Medicaid Other | Admitting: Obstetrics and Gynecology

## 2019-10-01 VITALS — BP 145/85 | HR 100

## 2019-10-01 DIAGNOSIS — Z3A37 37 weeks gestation of pregnancy: Secondary | ICD-10-CM

## 2019-10-01 DIAGNOSIS — O0990 Supervision of high risk pregnancy, unspecified, unspecified trimester: Secondary | ICD-10-CM

## 2019-10-01 DIAGNOSIS — Z8616 Personal history of COVID-19: Secondary | ICD-10-CM

## 2019-10-01 DIAGNOSIS — O0993 Supervision of high risk pregnancy, unspecified, third trimester: Secondary | ICD-10-CM

## 2019-10-01 DIAGNOSIS — O403XX0 Polyhydramnios, third trimester, not applicable or unspecified: Secondary | ICD-10-CM

## 2019-10-01 DIAGNOSIS — R898 Other abnormal findings in specimens from other organs, systems and tissues: Secondary | ICD-10-CM

## 2019-10-01 DIAGNOSIS — Z8279 Family history of other congenital malformations, deformations and chromosomal abnormalities: Secondary | ICD-10-CM

## 2019-10-01 NOTE — Progress Notes (Signed)
Mary Hays VIRTUAL VIDEO VISIT ENCOUNTER NOTE  Provider location: [facility] [place] connected with Mary Hays on 10/01/19 at  2:15 PM EST by MyChart Video Encounter at home and verified that I am speaking with the correct person using two identifiers.   I discussed the limitations, risks, security and privacy concerns of performing an evaluation and management service virtually and the availability of in person appointments. I also discussed with the patient that there may be a patient responsible charge related to this service. The patient expressed understanding and agreed to proceed. Subjective:  Mary Hays is a 29 y.o. 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  Patient reports no complaints.  Contractions: Irritability. Vag. Bleeding: None.  Movement: Present. Denies any leaking of fluid.   The following portions of the patient's history were reviewed and updated as appropriate: allergies, current medications, past family history, past medical history, past social history, past surgical history and problem list.   Objective:   Vitals:   10/01/19 1422  BP: (!) 145/85  Pulse: 100    Fetal Status:     Movement: Present     General:  Alert, oriented and cooperative. Patient is in no acute distress.  Respiratory: Normal respiratory effort, no problems with respiration noted  Mental Status: Normal mood and affect. Normal behavior. Normal judgment and thought content.  Rest of physical exam deferred due to  type of encounter  Imaging: [place] MFM OB FOLLOW UP  Result Date: 09/23/2019 ----------------------------------------------------------------------  OBSTETRICS REPORT                       (Signed Final 09/23/2019 04:49 pm) ---------------------------------------------------------------------- Patient Info  ID #:       [id number]                          D.O.B.:  1991/01/15 (28 yrs)  Name:       Mary Hays                    Visit Date: 09/23/2019 04:20 pm ---------------------------------------------------------------------- Performed By  Performed By:     Jeanene Erb BS,      Ref. Address:     [address]                                                             [address]  Attending:        Johnell Comings MD         [facility]  Referred By:      [facility] ---------------------------------------------------------------------- Orders   #  Description                          Code         Ordered By   1  [place] MFM OB FOLLOW UP                  [id number]     YU FANG  ----------------------------------------------------------------------   #  Order #                    Accession #                 Episode #   1  [id number]                  [id number]                  [id number]  ---------------------------------------------------------------------- Indications   Maternal care for known or suspected poor      O36.5930   fetal growth, third trimester, not applicable or   unspecified IUGR   Polyhydramnios, third trimester, antepartum    O40.3XX0   condition or complication, unspecified fetus   Encounter for other antenatal screening        Z36.2   follow-up (low risk NIPS)   Genetic carrier (ACADM) FOB declined           Z14.8   carrier screen   [redacted] weeks gestation of pregnancy                Z3A.35  ---------------------------------------------------------------------- Fetal Evaluation  Num Of Fetuses:          1  Fetal Heart Rate(bpm):  141  Cardiac Activity:       Observed  Presentation:           Cephalic  Placenta:               Posterior  P. Cord Insertion:      Previously Visualized  Amniotic Fluid  AFI FV:      Polyhydramnios  AFI Sum(cm)     %Tile       Largest Pocket(cm)  24.6            94          8.56  RUQ(cm)       RLQ(cm)       LUQ(cm)        LLQ(cm)  8.56          7.72          6.18           2.14 ---------------------------------------------------------------------- Biometry  BPD:      90.2  mm     G. Age:  36w 4d         76  %    CI:        76.18   %    70 - 86                                                          FL/HC:      21.8   %    20.1 - 22.1  HC:      327.5  mm     G. Age:  37w 1d         52  %    HC/AC:      1.01        0.93 - 1.11  AC:       324   mm     G. Age:  36w 2d         73  %    FL/BPD:     79.2   %    71 - 87  FL:       71.4  mm     G. Age:  36w 4d         64  %    FL/AC:      22.0   %    20 - 24  Est. FW:    2956  gm      6 lb 8 oz     69  % ---------------------------------------------------------------------- OB History  Gravidity:    3         Term:   2        Prem:   0        SAB:   0  TOP:          0       Ectopic:  0        Living: 2 ---------------------------------------------------------------------- Gestational Age  LMP:           35w 6d        Date:  01/15/19                 EDD:   10/22/19  U/S Today:     36w 5d                                        EDD:   10/16/19  Best:          35w 6d     Det. By:  LMP  (01/15/19)          EDD:   10/22/19 ---------------------------------------------------------------------- Anatomy  Cranium:               Appears normal         LVOT:                   Previously seen  Cavum:                 Previously seen        Aortic Arch:            Previously seen  Ventricles:            Appears normal         Ductal Arch:            Previously seen  Choroid Plexus:        Previously seen        Diaphragm:              Appears normal   Cerebellum:            Previously seen        Stomach:                Appears normal, left  sided  Posterior Fossa:       Previously seen        Abdomen:                Previously seen  Nuchal Fold:           Previously seen        Abdominal Wall:         Previously seen  Face:                  Orbits and profile     Cord Vessels:           Previously seen                         previously seen  Lips:                  Appears normal         Kidneys:                Appear normal  Palate:                Previously seen        Bladder:                Appears normal  Thoracic:              Appears normal         Spine:                  Previously seen  Heart:                 Previously seen        Upper Extremities:      Previously seen  RVOT:                  Previously seen        Lower Extremities:      Previously seen  Other:  Heels and 5th digits previously visualized. Nasal bone previously          visualized. ---------------------------------------------------------------------- Cervix Uterus Adnexa  Cervix  Not visualized (advanced GA >24wks) ---------------------------------------------------------------------- Comments  This patient was seen for a follow up growth scan due to fetal  growth restriction noted earlier in her pregnancy.  The patient  reports that she has been experiencing bilateral sciatic pain  for the past few weeks.  She has tried both hot and cold  compresses along with Tylenol without much relief of the  sciatic pain.  She was informed that the fetal growth appears appropriate  for her gestational age.  The fetus was not growth restricted  today.  Borderline polyhydramnios with a total AFI of over 24  cm was noted today.  The patient was advised that unfortunately,delivery is the only  relief for the sciatic pain that she is experiencing.  She was  advised that an induction may be scheduled at 39 weeks for  relief of her  sciatic pain. ----------------------------------------------------------------------                   Johnell Comings, MD Electronically Signed Final Report   09/23/2019 04:49 pm ----------------------------------------------------------------------   Assessment and Plan:  Pregnancy: [id number] at [redacted]w[redacted]d 1. Supervision of high risk pregnancy, antepartum Stable Labor precautions GBS negative   3. Polyhydramnios in third trimester complication, single or unspecified fetus Growth 1/20 IOL scheduled at 65 weeks per MFM  4. 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 History of COVID-19 Asx presently  Term labor symptoms and general obstetric precautions including but not limited to vaginal bleeding, contractions, leaking of fluid and fetal movement were reviewed in detail with the patient. I discussed the assessment and treatment plan with the patient. The patient was provided an opportunity to ask questions and all were answered. The patient agreed with the plan and demonstrated an understanding of the instructions. The patient was advised to call back or seek an in-person office evaluation/go to [facility] at [facility] for any urgent or concerning symptoms. Please refer to After Visit Summary for other counseling recommendations.   I provided 8 minutes of face-to-face time during this encounter.  Return in about 1 week (around 10/08/2019) for OB visit, virtual, MD provider.  Future Appointments  Date Time Provider [place]  10/15/2019  9:25 AM [facility] [place] None    Chancy Milroy, [place] [facility], [place]

## 2019-10-01 NOTE — Progress Notes (Signed)
I connected with  [name] on 10/01/19 at  2:15 PM EST by telephone and verified that I am speaking with the correct person using two identifiers.   I discussed the limitations, risks, security and privacy concerns of performing an evaluation and management service by telephone and the availability of in person appointments. I also discussed with the patient that there may be a patient responsible charge related to this service. The patient expressed understanding and agreed to proceed.  Bethanne Ginger, [place] 10/01/2019  2:21 PM

## 2019-10-02 ENCOUNTER — Other Ambulatory Visit: Payer: Self-pay | Admitting: Obstetrics and Gynecology

## 2019-10-06 ENCOUNTER — Telehealth (HOSPITAL_COMMUNITY): Payer: Self-pay | Admitting: *Deleted

## 2019-10-06 ENCOUNTER — Encounter (HOSPITAL_COMMUNITY): Payer: Self-pay | Admitting: *Deleted

## 2019-10-06 NOTE — Telephone Encounter (Signed)
Preadmission screen  

## 2019-10-08 ENCOUNTER — Other Ambulatory Visit (HOSPITAL_COMMUNITY): Payer: Self-pay | Admitting: Advanced Practice Midwife

## 2019-10-08 ENCOUNTER — Other Ambulatory Visit: Payer: Self-pay

## 2019-10-08 ENCOUNTER — Encounter: Payer: Self-pay | Admitting: Obstetrics and Gynecology

## 2019-10-08 ENCOUNTER — Telehealth (INDEPENDENT_AMBULATORY_CARE_PROVIDER_SITE_OTHER): Payer: Medicaid Other | Admitting: Obstetrics and Gynecology

## 2019-10-08 VITALS — BP 121/72 | HR 100

## 2019-10-08 DIAGNOSIS — Z87898 Personal history of other specified conditions: Secondary | ICD-10-CM

## 2019-10-08 DIAGNOSIS — O0993 Supervision of high risk pregnancy, unspecified, third trimester: Secondary | ICD-10-CM

## 2019-10-08 DIAGNOSIS — B009 Herpesviral infection, unspecified: Secondary | ICD-10-CM

## 2019-10-08 DIAGNOSIS — R898 Other abnormal findings in specimens from other organs, systems and tissues: Secondary | ICD-10-CM

## 2019-10-08 DIAGNOSIS — O403XX0 Polyhydramnios, third trimester, not applicable or unspecified: Secondary | ICD-10-CM

## 2019-10-08 DIAGNOSIS — Z1159 Encounter for screening for other viral diseases: Secondary | ICD-10-CM

## 2019-10-08 DIAGNOSIS — Z8616 Personal history of COVID-19: Secondary | ICD-10-CM

## 2019-10-08 DIAGNOSIS — O0990 Supervision of high risk pregnancy, unspecified, unspecified trimester: Secondary | ICD-10-CM

## 2019-10-08 MED ORDER — VALACYCLOVIR HCL 500 MG PO TABS: 500.0000 mg | ORAL_TABLET | Freq: Two times a day (BID) | ORAL | 2 refills | Status: DC

## 2019-10-08 NOTE — Progress Notes (Signed)
   [place] VIRTUAL VIDEO VISIT ENCOUNTER NOTE  Provider location: Center for [employer] at [place] connected with [name] on 10/08/19 at  1:35 PM EST by MyChart Video Encounter at home and verified that I am speaking with the correct person using two identifiers.   I discussed the limitations, risks, security and privacy concerns of performing an evaluation and management service virtually and the availability of in person appointments. I also discussed with the patient that there may be a patient responsible charge related to this service. The patient expressed understanding and agreed to proceed. Subjective:  Mary Hays is a 29 y.o. 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  Patient reports sciatic pain.  Contractions: Irritability. Vag. Bleeding: None.  Movement: Present. Denies any leaking of fluid.   The following portions of the patient's history were reviewed and updated as appropriate: allergies, current medications, past family history, past medical history, past social history, past surgical history and problem list.   Objective:   Vitals:   10/08/19 1319  BP: 121/72  Pulse: 100   Fetal Status:     Movement: Present     General:  Alert, oriented and cooperative. Patient is in no acute distress.  Respiratory: Normal respiratory effort, no problems with respiration noted  Mental Status: Normal mood and affect. Normal behavior. Normal judgment and thought content.  Rest of physical exam deferred due to type of  encounter  Imaging:  Assessment and Plan:  Pregnancy: [id number] at [redacted]w[redacted]d  1. Supervision of high risk pregnancy, antepartum Has IOL scheduled for 39 weeks  2. History of poor fetal growth resolved  3. 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 History of COVID-19 Dx 09/08/19  5. Polyhydramnios in third trimester complication, single or unspecified fetus AFI 24.6 last [place]  6. 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 I discussed the assessment and treatment plan with the patient. The patient was provided an opportunity to ask questions and all were answered. The patient agreed with the plan and demonstrated an understanding of the instructions. The patient was advised to call back or seek an in-person office evaluation/go to [facility] at [facility] for any urgent or concerning symptoms. Please refer to After Visit Summary for other counseling recommendations.   I provided 15 minutes of face-to-face time during this encounter.  Return in about 5 weeks (around 11/12/2019) for post partum check.  Future Appointments  Date Time Provider [place]  10/15/2019  9:25 AM [facility] [place] None    Sloan Leiter, [place] for [place]

## 2019-10-08 NOTE — Progress Notes (Signed)
I connected with  [name] on 10/08/19 at 1318 by telephone and verified that I am speaking with the correct person using two identifiers.   I discussed the limitations, risks, security and privacy concerns of performing an evaluation and management service by telephone and the availability of in person appointments. I also discussed with the patient that there may be a patient responsible charge related to this service. The patient expressed understanding and agreed to proceed.  Annabell Howells, RN 10/08/2019  1:18 PM

## 2019-10-13 ENCOUNTER — Other Ambulatory Visit (HOSPITAL_COMMUNITY): Payer: Medicaid Other

## 2019-10-14 ENCOUNTER — Other Ambulatory Visit: Payer: Self-pay | Admitting: Advanced Practice Midwife

## 2019-10-15 ENCOUNTER — Inpatient Hospital Stay (HOSPITAL_COMMUNITY)
Admission: AD | Admit: 2019-10-15 | Discharge: 2019-10-17 | DRG: 807 | Disposition: A | Discharge status: Home / Self Care | Payer: Medicaid Other | Attending: Obstetrics and Gynecology | Admitting: Obstetrics and Gynecology

## 2019-10-15 ENCOUNTER — Inpatient Hospital Stay (HOSPITAL_COMMUNITY): Payer: Medicaid Other

## 2019-10-15 ENCOUNTER — Encounter (HOSPITAL_COMMUNITY): Payer: Self-pay | Admitting: Obstetrics and Gynecology

## 2019-10-15 ENCOUNTER — Other Ambulatory Visit: Payer: Self-pay

## 2019-10-15 DIAGNOSIS — O0990 Supervision of high risk pregnancy, unspecified, unspecified trimester: Secondary | ICD-10-CM

## 2019-10-15 DIAGNOSIS — O9832 Other infections with a predominantly sexual mode of transmission complicating childbirth: Secondary | ICD-10-CM | POA: Diagnosis present

## 2019-10-15 DIAGNOSIS — Z1159 Encounter for screening for other viral diseases: Secondary | ICD-10-CM

## 2019-10-15 DIAGNOSIS — B009 Herpesviral infection, unspecified: Secondary | ICD-10-CM

## 2019-10-15 DIAGNOSIS — Z3A39 39 weeks gestation of pregnancy: Secondary | ICD-10-CM

## 2019-10-15 DIAGNOSIS — Z8279 Family history of other congenital malformations, deformations and chromosomal abnormalities: Secondary | ICD-10-CM

## 2019-10-15 DIAGNOSIS — O403XX0 Polyhydramnios, third trimester, not applicable or unspecified: Secondary | ICD-10-CM | POA: Diagnosis present

## 2019-10-15 DIAGNOSIS — M5430 Sciatica, unspecified side: Secondary | ICD-10-CM | POA: Diagnosis present

## 2019-10-15 DIAGNOSIS — Z8616 Personal history of COVID-19: Secondary | ICD-10-CM | POA: Diagnosis not present

## 2019-10-15 DIAGNOSIS — A6000 Herpesviral infection of urogenital system, unspecified: Secondary | ICD-10-CM | POA: Diagnosis present

## 2019-10-15 DIAGNOSIS — O409XX0 Polyhydramnios, unspecified trimester, not applicable or unspecified: Secondary | ICD-10-CM | POA: Diagnosis present

## 2019-10-15 DIAGNOSIS — O26893 Other specified pregnancy related conditions, third trimester: Secondary | ICD-10-CM | POA: Diagnosis present

## 2019-10-15 LAB — CBC
HCT: 34.2 % — ABNORMAL LOW (ref 36.0–46.0)
Hemoglobin: 9.9 g/dL — ABNORMAL LOW (ref 12.0–15.0)
MCH: 20.6 pg — ABNORMAL LOW (ref 26.0–34.0)
MCHC: 28.9 g/dL — ABNORMAL LOW (ref 30.0–36.0)
MCV: 71.1 fL — ABNORMAL LOW (ref 80.0–100.0)
Platelets: 287 10*3/uL (ref 150–400)
RBC: 4.81 MIL/uL (ref 3.87–5.11)
RDW: 16.8 % — ABNORMAL HIGH (ref 11.5–15.5)
WBC: 13.8 10*3/uL — ABNORMAL HIGH (ref 4.0–10.5)
nRBC: 0.1 % (ref 0.0–0.2)

## 2019-10-15 LAB — TYPE AND SCREEN
ABO/RH(D): O POS
Antibody Screen: NEGATIVE

## 2019-10-15 LAB — ABO/RH: ABO/RH(D): O POS

## 2019-10-15 MED ORDER — LIDOCAINE HCL (PF) 1 % IJ SOLN
30.0000 mL | INTRAMUSCULAR | Status: AC | PRN
  Administered 2019-10-15: 22:00:00 30 mL via SUBCUTANEOUS
  Filled 2019-10-15: qty 30

## 2019-10-15 MED ORDER — FENTANYL CITRATE (PF) 100 MCG/2ML IJ SOLN
100.0000 ug | INTRAMUSCULAR | Status: DC | PRN
  Administered 2019-10-15 (×3): 100 ug via INTRAVENOUS
  Filled 2019-10-15 (×2): qty 2

## 2019-10-15 MED ORDER — FENTANYL CITRATE (PF) 100 MCG/2ML IJ SOLN: 50.0000 ug | INTRAMUSCULAR | Status: DC | PRN

## 2019-10-15 MED ORDER — SOD CITRATE-CITRIC ACID 500-334 MG/5ML PO SOLN: 30.0000 mL | ORAL | Status: DC | PRN

## 2019-10-15 MED ORDER — TERBUTALINE SULFATE 1 MG/ML IJ SOLN: 0.2500 mg | Freq: Once | INTRAMUSCULAR | Status: DC | PRN

## 2019-10-15 MED ORDER — ONDANSETRON HCL 4 MG/2ML IJ SOLN
4.0000 mg | Freq: Four times a day (QID) | INTRAMUSCULAR | Status: DC | PRN
  Administered 2019-10-15: 19:00:00 4 mg via INTRAVENOUS
  Filled 2019-10-15: qty 2

## 2019-10-15 MED ORDER — OXYTOCIN 40 UNITS IN NORMAL SALINE INFUSION - SIMPLE MED
2.5000 [IU]/h | INTRAVENOUS | Status: DC
  Filled 2019-10-15: qty 1000

## 2019-10-15 MED ORDER — OXYTOCIN BOLUS FROM INFUSION
500.0000 mL | Freq: Once | INTRAVENOUS | Status: AC
  Administered 2019-10-15: 22:00:00 500 mL via INTRAVENOUS

## 2019-10-15 MED ORDER — MISOPROSTOL 50MCG HALF TABLET
ORAL_TABLET | ORAL | Status: AC
  Filled 2019-10-15: qty 1

## 2019-10-15 MED ORDER — ACETAMINOPHEN 325 MG PO TABS: 650.0000 mg | ORAL_TABLET | ORAL | Status: DC | PRN

## 2019-10-15 MED ORDER — FENTANYL CITRATE (PF) 100 MCG/2ML IJ SOLN
INTRAMUSCULAR | Status: AC
  Filled 2019-10-15: qty 2

## 2019-10-15 MED ORDER — MISOPROSTOL 25 MCG QUARTER TABLET
25.0000 ug | ORAL_TABLET | ORAL | Status: DC | PRN
  Administered 2019-10-15: 10:00:00 25 ug via VAGINAL
  Filled 2019-10-15: qty 1

## 2019-10-15 MED ORDER — LACTATED RINGERS IV SOLN: INTRAVENOUS | Status: DC

## 2019-10-15 MED ORDER — MISOPROSTOL 50MCG HALF TABLET
50.0000 ug | ORAL_TABLET | ORAL | Status: DC | PRN
  Administered 2019-10-15: 14:00:00 50 ug via ORAL

## 2019-10-15 MED ORDER — LACTATED RINGERS IV SOLN: 500.0000 mL | INTRAVENOUS | Status: DC | PRN

## 2019-10-15 NOTE — Progress Notes (Signed)
Vitals:   10/15/19 1932 10/15/19 2046  BP: (!) 113/55 117/66  Pulse: 67 78  Resp: (!) 21   Temp:    SpO2:     Breathing w/ctx, doing well.  FHR Cat 1.  Ctx q 2-3 minutes. Cx 5/70/-2/posterior  AROM w/clear fluid.  Continue present mgt.

## 2019-10-15 NOTE — Progress Notes (Signed)
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 [id number] at [redacted]w[redacted]d presented for IOL d/t sciatic pain S: Patient having intense pain  O:  BP 113/71   Pulse 90   Temp 98.4 F (36.9 C) (Axillary)   Resp 20   Ht 5\' 3"  (1.6 m)   Wt 70.3 kg   LMP 01/15/2019 (Exact Date)   SpO2 98%   BMI 27.46 kg/m  EFM: 135 bpm/+ accels, moderate variability/-decels  CVE: Dilation: 5 Effacement (%): 50 Cervical Position: Posterior Station: -3 Presentation: Vertex Exam by:: Janace Litten, MD   A&P: 29 y.o. [id number] [redacted]w[redacted]d here for IOL d/t sciatic pain #Labor: Progressing well. S/p cyto x2, fb. Third dose of cytotec not given due to q1-60min ctx. Patient making change, expectant management for now. Can consider pitocin if ctx space out. #Pain: Per patient request, wishing to try w/o epidural #FWB: Cat I #GBS negative  [name], MD 7:24 PM

## 2019-10-15 NOTE — Progress Notes (Signed)
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 [id number] at [redacted]w[redacted]d presented for IOL d/t sciatic pain S: Patient more comfortable bouncing on labor ball  O:  BP 113/71   Pulse 90   Temp 98.4 F (36.9 C) (Axillary)   Resp 20   Ht 5\' 3"  (1.6 m)   Wt 70.3 kg   LMP 01/15/2019 (Exact Date)   SpO2 98%   BMI 27.46 kg/m  EFM: 140 bpm/+ accels, moderate variability/-decels  CVE: Dilation: 5 Effacement (%): 40 Cervical Position: Posterior Station: -3 Presentation: Vertex Exam by:: Mary Martinique Johnson, RN    A&P: 29 y.o. [phone] [redacted]w[redacted]d here for IOL d/t sciatic pain #Labor: Progressing well. S/p cyto x2, fb. Effacement still quite thick, plan to give third cytotec and hopefully progress to pitocin at next check. #Pain: Per patient request, wishing to try w/o epidural #FWB: Cat I #GBS negative  [name], MD 6:46 PM

## 2019-10-15 NOTE — Progress Notes (Signed)
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 Chauncey Reading   A&P: 29 y.o. [id number] [redacted]w[redacted]d here for IOL d/t sciatic pain #Labor: Progressing well. S/p cyto x2, fb. Recheck at 1815, hopefully able to start pitocin at that time. #Pain: Per patient request, wishing to try w/o epidural #FWB: Cat I #GBS negative  [name], MD 3:42 PM

## 2019-10-15 NOTE — Progress Notes (Signed)
Mary Hays is a 29 y.o. [id number] at [redacted]w[redacted]d   Subjective: Resting comfortably in bed, significant other at bedside and supportive, engaged in care  Objective: BP (!) 100/47 (BP Location: Right Arm)   Pulse 70   Temp 98.4 F (36.9 C) (Oral)   Resp 16   LMP 01/15/2019 (Exact Date)   SpO2 98%  No intake/output data recorded. No intake/output data recorded.  FHT:  FHR: 135 bpm, variability: moderate,  accelerations:  Present,  decelerations:  Absent UC:   occasional SVE:   Dilation: 1.5 Effacement (%): Thick Station: Ballotable Exam by:: Dr Chauncey Reading  Labs: Lab Results  Component Value Date   WBC 13.8 (H) 10/15/2019   HGB 9.9 (L) 10/15/2019   HCT 34.2 (L) 10/15/2019   MCV 71.1 (L) 10/15/2019   PLT 287 10/15/2019    Assessment / Plan: --30 y.o. [id number] at [redacted]w[redacted]d  --GBS Neg --S/p Cytotec #1 at 1002 --At bedside to introduce myself as part of care team --No questions or concerns posed by patient or partner --Assess for foley bulb placement and additional Cytotec around 1400 hours --Anticipate NSVD   Crissie Figures [place] 10/15/2019, 11:53 AM

## 2019-10-15 NOTE — H&P (Addendum)
OBSTETRIC ADMISSION HISTORY AND PHYSICAL  Mary Hays is a 29 y.o. female [phone] with IUP at [redacted]w[redacted]d by LMP c/w 13 wk [place] presenting for IOL d/t sciatic pain. She reports +FMs, No LOF, no VB, no blurry vision, headaches or peripheral edema, and RUQ pain.  She plans on bottle feeding. She requests natural family planning for birth control. 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 No pertinent surgical history.  Obstetrical History: OB History     Gravida  4   Para  2   Term  2   Preterm  0   AB  1   Living  2      SAB  1   TAB  0   Ectopic  0   Multiple  0   Live Births  2           Social History: Social History   Socioeconomic History   Marital status: Married    Spouse name: Not on file   Number of children: Not on file   Years of education: Not on file   Highest education level: Not on file  Occupational History   Not on file  Tobacco Use   Smoking status: Never Smoker   Smokeless tobacco: Never Used  Substance and Sexual Activity   Alcohol use: No   Drug use: No   Sexual activity: Not Currently  Other Topics Concern   Not on file  Social History Narrative   Not on file   Social Determinants of Health   Financial  Resource Strain:    Difficulty of Paying Living Expenses: Not on file  Food Insecurity: No Food Insecurity   Worried About Running Out of Food in the Last Year: Never true   Ran Out of Food in the Last Year: Never true  Transportation Needs: No Transportation Needs   Lack of Transportation (Medical): No   Lack of Transportation (Non-Medical): No  Physical Activity:    Days of Exercise per Week: Not on file   Minutes of Exercise per Session: Not on file  Stress:    Feeling of Stress : Not on file  Social Connections:    Frequency of Communication with Friends and Family: Not on file   Frequency of Social Gatherings with Friends and Family: Not on file   Attends Religious Services: Not on file   Active Member of Clubs or Organizations: Not on file   Attends Archivist Meetings: Not on file   Marital Status: Not on file    Family History: Family History  Problem Relation Age of Onset   Diabetes Father    Intellectual disability Sister     Allergies: No Known Allergies  Medications Prior to Admission  Medication Sig Dispense Refill Last Dose   acetaminophen (TYLENOL) 325 MG tablet Take 650 mg  by mouth every 6 (six) hours as needed.      Ascorbic Acid (VITAMIN C PO) Take by mouth.      cyclobenzaprine (FLEXERIL) 10 MG tablet Take 1 tablet (10 mg total) by mouth every 8 (eight) hours as needed for muscle spasms. 20 tablet 1    ferrous sulfate 325 (65 FE) MG tablet Take 1 tablet (325 mg total) by mouth daily with breakfast. (Patient not taking: Reported on 08/24/2019) 30 tablet 3    Prenatal Vit-Fe Fumarate-FA (PRENATAL MULTIVITAMIN) TABS tablet Take 1 tablet by mouth daily.       valACYclovir (VALTREX) 500 MG tablet Take 1 tablet (500 mg total) by mouth 2 (two) times daily. 60 tablet 2      Review of Systems   All systems reviewed and negative except as stated in HPI  Blood pressure 102/66, pulse 82, temperature 98.4 F (36.9 C), temperature source Oral, resp. rate  18, last menstrual period 01/15/2019, SpO2 98 %. General appearance: alert, cooperative, appears stated age and no distress Lungs: normal effort Heart: regular rate  Abdomen: soft, non-tender; bowel sounds normal Pelvic: gravid uterus GU: No vaginal lesions  Extremities: Homans sign is negative, no sign of DVT DTR's intact Presentation: cephalic by BSUS Fetal monitoringBaseline: 130 bpm, Variability: Good {> 6 bpm), Accelerations: Reactive and Decelerations: Absent Uterine activity: Frequency: Every 10 minutes Dilation: 1.5 Effacement (%): Thick Station: Ballotable Exam by:: Dr Chauncey Reading   Prenatal labs: ABO, Rh: --/--/O POS, O POS Performed at [facility], [place] [address], [place], [place] [address]  [phone]) Antibody: NEG (02/11 [id number]) Rubella: 4.25 (10/06 1326) RPR: Non Reactive (12/02 0859)  HBsAg: Negative (08/11 1008)  HIV: Non Reactive (12/02 0859)  GBS: Negative/-- (01/22 1026)  2 hr Glucola - 3rd trimester A1c 5.3 Genetic screening  NIPS low risk female, maternal MCADD carrier, h/o trisomy 24 in family Anatomy US FGR resolved, borderline polyhydramnios  Prenatal Transfer Tool  Maternal Diabetes: No Genetic Screening: Normal maternal MCADD carrier, fam h/o trisomy 21 Maternal Ultrasounds/Referrals: IUGR resolved, borderline polyhydramnios Fetal Ultrasounds or other Referrals:  Referred to Materal Fetal Medicine  Maternal Substance Abuse:  No Significant Maternal Medications:  None Valtrex Significant Maternal Lab Results: Group B Strep negative, HSV positive  Results for orders placed or performed during the hospital encounter of 10/15/19 (from the past 24 hour(s))  CBC   Collection Time: 10/15/19  8:42 AM  Result Value Ref Range   WBC 13.8 (H) 4.0 - 10.5 K/uL   RBC 4.81 3.87 - 5.11 MIL/uL   Hemoglobin 9.9 (L) 12.0 - 15.0 g/dL   HCT 34.2 (L) 36.0 - 46.0 %   MCV 71.1 (L) 80.0 - 100.0 fL   MCH 20.6 (L) 26.0 - 34.0 pg   MCHC 28.9 (L) 30.0 - 36.0 g/dL    RDW 16.8 (H) 11.5 - 15.5 %   Platelets 287 150 - 400 K/uL   nRBC 0.1 0.0 - 0.2 %  Type and screen   Collection Time: 10/15/19  8:42 AM  Result Value Ref Range   ABO/RH(D) O POS    Antibody Screen NEG    Sample Expiration      10/18/2019,2359 Performed at [facility], 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 [id number] at [redacted]w[redacted]d here for IOL d/t   #Labor: Risks and benefits of induction were reviewed, including failure of method, prolonged labor, need for further intervention, risk of cesarean.  Patient and family seem to understand these risks and wish to proceed. Options of cytotec, foley bulb, AROM, and pitocin reviewed, with use of each discussed. Cytotec x1 placed, hopefully able to place FB at next check. #borderline polyhydramnios: AFI 24 on 09/23/2019. #H/O FGR: resolved on 08/12/19. #HSV: no outbreaks per patient. On Valtrex x1 week. No lesions on exam. #Genetic carrier status: Maternal carrier for medium chain acyl-coa dehydrogenase deficiency on [employer] carrier screen, FOB did not get testing. Family hx of down's syndrome (mother's sister). #Pain: Per patient request #FWB: Cat I; EFW: 3700g #ID:  GBS negative #MOF: bottle #MOC: natural  family planning #Circ:  n/a  [name], MD [facility], PGY-1 10/15/2019, 10:33 AM   I confirm that I have verified the information documented in the resident's note and that I have also personally reperformed the history, physical exam and all medical decision making activities of this service and have verified that all service and findings are accurately documented in this student's note.   Rosine Abe, CNM 10/15/2019 11:33 AM

## 2019-10-16 ENCOUNTER — Encounter (HOSPITAL_COMMUNITY): Payer: Self-pay | Admitting: Obstetrics and Gynecology

## 2019-10-16 DIAGNOSIS — Z3A39 39 weeks gestation of pregnancy: Secondary | ICD-10-CM

## 2019-10-16 LAB — RPR: RPR Ser Ql: NONREACTIVE

## 2019-10-16 MED ORDER — ACETAMINOPHEN 325 MG PO TABS
650.0000 mg | ORAL_TABLET | ORAL | Status: DC | PRN
  Administered 2019-10-16: 09:00:00 650 mg via ORAL
  Filled 2019-10-16: qty 2

## 2019-10-16 MED ORDER — ACETAMINOPHEN 325 MG PO TABS: 650.0000 mg | ORAL_TABLET | Freq: Four times a day (QID) | ORAL | 0 refills | Status: DC | PRN

## 2019-10-16 MED ORDER — DIBUCAINE (PERIANAL) 1 % EX OINT
1.0000 "application " | TOPICAL_OINTMENT | CUTANEOUS | Status: DC | PRN
  Filled 2019-10-16: qty 28

## 2019-10-16 MED ORDER — IBUPROFEN 600 MG PO TABS
600.0000 mg | ORAL_TABLET | Freq: Four times a day (QID) | ORAL | Status: DC
  Administered 2019-10-16 (×5): 600 mg via ORAL
  Filled 2019-10-16 (×5): qty 1

## 2019-10-16 MED ORDER — FLEET ENEMA 7-19 GM/118ML RE ENEM: 1.0000 | ENEMA | Freq: Every day | RECTAL | Status: DC | PRN

## 2019-10-16 MED ORDER — TETANUS-DIPHTH-ACELL PERTUSSIS 5-2.5-18.5 LF-MCG/0.5 IM SUSP: 0.5000 mL | Freq: Once | INTRAMUSCULAR | Status: DC

## 2019-10-16 MED ORDER — DIPHENHYDRAMINE HCL 25 MG PO CAPS: 25.0000 mg | ORAL_CAPSULE | Freq: Four times a day (QID) | ORAL | Status: DC | PRN

## 2019-10-16 MED ORDER — PRENATAL MULTIVITAMIN CH
1.0000 | ORAL_TABLET | Freq: Every day | ORAL | Status: DC
  Administered 2019-10-16: 09:00:00 1 via ORAL
  Filled 2019-10-16: qty 1

## 2019-10-16 MED ORDER — ONDANSETRON HCL 4 MG PO TABS: 4.0000 mg | ORAL_TABLET | ORAL | Status: DC | PRN

## 2019-10-16 MED ORDER — METHYLERGONOVINE MALEATE 0.2 MG PO TABS: 0.2000 mg | ORAL_TABLET | ORAL | Status: DC | PRN

## 2019-10-16 MED ORDER — DOCUSATE SODIUM 100 MG PO CAPS
100.0000 mg | ORAL_CAPSULE | Freq: Two times a day (BID) | ORAL | Status: DC
  Administered 2019-10-16 (×2): 100 mg via ORAL
  Filled 2019-10-16 (×2): qty 1

## 2019-10-16 MED ORDER — ZOLPIDEM TARTRATE 5 MG PO TABS: 5.0000 mg | ORAL_TABLET | Freq: Every evening | ORAL | Status: DC | PRN

## 2019-10-16 MED ORDER — BENZOCAINE-MENTHOL 20-0.5 % EX AERO
1.0000 "application " | INHALATION_SPRAY | CUTANEOUS | Status: DC | PRN
  Filled 2019-10-16: qty 56

## 2019-10-16 MED ORDER — MEASLES, MUMPS & RUBELLA VAC IJ SOLR: 0.5000 mL | Freq: Once | INTRAMUSCULAR | Status: DC

## 2019-10-16 MED ORDER — COCONUT OIL OIL: 1.0000 "application " | TOPICAL_OIL | Status: DC | PRN

## 2019-10-16 MED ORDER — BISACODYL 10 MG RE SUPP: 10.0000 mg | Freq: Every day | RECTAL | Status: DC | PRN

## 2019-10-16 MED ORDER — FERROUS SULFATE 325 (65 FE) MG PO TABS
325.0000 mg | ORAL_TABLET | Freq: Two times a day (BID) | ORAL | Status: DC
  Filled 2019-10-16: qty 1

## 2019-10-16 MED ORDER — IBUPROFEN 600 MG PO TABS: 600.0000 mg | ORAL_TABLET | Freq: Four times a day (QID) | ORAL | 0 refills | Status: DC

## 2019-10-16 MED ORDER — WITCH HAZEL-GLYCERIN EX PADS: 1.0000 "application " | MEDICATED_PAD | CUTANEOUS | Status: DC | PRN

## 2019-10-16 MED ORDER — SIMETHICONE 80 MG PO CHEW: 80.0000 mg | CHEWABLE_TABLET | ORAL | Status: DC | PRN

## 2019-10-16 MED ORDER — METHYLERGONOVINE MALEATE 0.2 MG/ML IJ SOLN: 0.2000 mg | INTRAMUSCULAR | Status: DC | PRN

## 2019-10-16 MED ORDER — ONDANSETRON HCL 4 MG/2ML IJ SOLN: 4.0000 mg | INTRAMUSCULAR | Status: DC | PRN

## 2019-10-16 NOTE — Discharge Instructions (Signed)

## 2019-10-16 NOTE — Progress Notes (Signed)
Post Partum Day 1 Subjective: no complaints, up ad lib, voiding, tolerating PO and + flatus  Objective: Blood pressure 106/65, pulse (!) 57, temperature 98 F (36.7 C), temperature source Oral, resp. rate 18, height 5\' 3"  (1.6 m), weight 70.3 kg, last menstrual period 01/15/2019, SpO2 99 %, unknown if currently breastfeeding.  Physical Exam:  General: alert, cooperative, appears stated age and no distress Lochia: appropriate Uterine Fundus: firm Incision: N/A DVT Evaluation: No evidence of DVT seen on physical exam.  Recent Labs    10/15/19 0842  HGB 9.9*  HCT 34.2*    Assessment/Plan: Patient requests discharge home this evening if Pediatrics is agreeable. I discussed that she will be 24 hours old a little after 10pm, icy weather, would advise waiting until morning.  Encouraged patient to notify night shift nurse that they desire discharge, assess Ped's willingness to discharge baby.   LOS: 1 day   Darlina Rumpf, CNM 10/16/2019, 6:54 PM

## 2019-10-16 NOTE — Discharge Summary (Signed)
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 yo [id number] admitted for IOL for polyhydramnios on 10/15/2019. Patient had an uncomplicated labor course as follows:  Membrane Rupture Time/Date: 8:39 PM ,10/15/2019   Intrapartum Procedures: Episiotomy: None [1]                                         Lacerations:  1st degree [2];Perineal [11]  Patient had a delivery of a Viable infant. 10/15/2019  Information for the patient's newborn:  MuLe, Ferraz0[[id number]]Delivery Method: Vaginal, Spontaneous(Filed from Delivery Summary)     Pateint had an uncomplicated postpartum course. Early discharge as peds okay with this. She is ambulating, tolerating a regular diet, passing flatus, and urinating well. Patient is discharged home in stable condition on 10/16/19.  Delivery time: 10:21 PM    Magnesium Sulfate received: No BMZ received: No Rhophylac:No MMR:No Transfusion:No  Physical exam  Vitals:   10/16/19 0146 10/16/19 0557 10/16/19 0910 10/16/19 1300  BP: (!) 99/55 (!) 112/52 100/60 106/65  Pulse: 67 66 70 (!) 57  Resp: '19 18 16 18  ' Temp: 98.3 F (36.8 C) 98.4 F (36.9 C)  98.4 F (36.9 C) 98 F (36.7 C)  TempSrc: Oral Oral Oral Oral  SpO2: 98% 98% 100% 99%  Weight:      Height:       General: alert, cooperative and no distress Lochia: appropriate Uterine Fundus: firm Incision: N/A DVT Evaluation: No evidence of DVT seen on physical exam. Labs: Lab Results  Component Value Date   WBC 13.8 (H) 10/15/2019   HGB 9.9 (L) 10/15/2019   HCT 34.2 (L) 10/15/2019   MCV 71.1 (L) 10/15/2019   PLT 287 10/15/2019   CMP Latest Ref Rng & Units 09/10/2019  Glucose 70 - 99 mg/dL 89  BUN 6 - 20 mg/dL <5(L)  Creatinine 0.44 - 1.00 mg/dL 0.39(L)  Sodium 135 - 145 mmol/L 136  Potassium 3.5 - 5.1 mmol/L 3.5  Chloride 98 - 111 mmol/L 106  CO2 22 - 32 mmol/L 21(L)  Calcium 8.9 - 10.3 mg/dL 8.9  Total Protein 6.5 - 8.1 g/dL 5.9(L)  Total Bilirubin 0.3 -  1.2 mg/dL 0.4  Alkaline Phos 38 - 126 U/L 76  AST 15 - 41 U/L 17  ALT 0 - 44 U/L 14   Edinburgh Score: Edinburgh Postnatal Depression Scale Screening Tool 10/16/2019  I have been able to laugh and see the funny side of things. 0  I have looked forward with enjoyment to things. 0  I have blamed myself unnecessarily when things went wrong. 0  I have been anxious or worried for no good reason. 0  I have felt scared or panicky for no good reason. 0  Things have been getting on top of me. 0  I have been so unhappy that I have had difficulty sleeping. 0  I have felt sad or miserable. 0  I have been so unhappy that I have been crying. 0  The thought of harming myself has occurred to me. 0  Edinburgh Postnatal Depression Scale Total 0    Discharge instruction: per After Visit Summary and "Baby and Me Booklet".  After visit meds:  Allergies as of 10/16/2019   No Known Allergies     Medication List    STOP taking these medications   valACYclovir 500 MG tablet Commonly known as: VALTREX     TAKE these medications   acetaminophen 325 MG tablet Commonly known as: Tylenol Take 2 tablets (650 mg total) by mouth  every 6 (six) hours as needed (for pain scale < 4). What changed: reasons to take this   cyclobenzaprine 10 MG tablet Commonly known as: FLEXERIL Take 1 tablet (10 mg total) by mouth every 8 (eight) hours as needed for muscle spasms.   ferrous sulfate 325 (65 FE) MG tablet Take 1 tablet (325 mg total) by mouth daily with breakfast.   ibuprofen 600 MG tablet Commonly known as: ADVIL Take 1 tablet (600 mg total) by mouth every 6 (six) hours. Start taking on: October 17, 2019   prenatal multivitamin Tabs tablet Take 1 tablet by mouth daily.   VITAMIN C GUMMIE PO Take 2 each by mouth daily.       Diet: routine diet  Activity: Advance as tolerated. Pelvic rest for 6 weeks.   Outpatient follow up:4 weeks Follow up Appt: Future Appointments  Date Time Provider [place]  11/12/2019  2:15 PM Rasch, Artist Pais, NP [facility]   Follow up Visit:    Please schedule this patient for Postpartum visit in: 4 weeks with the following provider: Any provider  Virtual For C/S patients schedule nurse incision check in weeks 2 weeks: no High risk pregnancy complicated by: polyhydramnios Delivery mode:  SVD Anticipated Birth Control:  NFP PP Procedures needed:   Schedule Integrated BH visit: no   Newborn Data: Live born female  Birth Weight: 3195g   APGAR: 71, 9  Newborn Delivery   Birth date/time: 10/15/2019 22:21:00 Delivery type: Vaginal, Spontaneous      Baby Feeding: Bottle Disposition:home with mother   10/16/2019 Chauncey Mann, MD

## 2019-10-16 NOTE — Progress Notes (Addendum)
Received bedside shift report on patient and infant. Patient and FOB stated they would like to go home tonight after infant's 24 hour testing. Spoke with patient's provider and they stated they will discharge MOB if Ped's is onboard to discharge infant. Spoke with Dr. Abelina Bachelor and she ordered the congenital heart screen to be done now and obtain a TCB then page her with results. Congenital heart screen passed and TCB in 75th percentile. Dr. Abelina Bachelor stated they can follow-up outpatient on the jaundice level. Dr. Abelina Bachelor also ordered to collect PKU at 24 hour mark and do hearing screen now. Her orders are if hearing screen is referred, schedule outpatient testing. After hearing screen and PKU are completed, page Dr. Abelina Bachelor so she may enter discharge orders. Called Dr. Marice Potter and explained that infant will be discharged tonight and she stated she will enter discharge orders for patient. Plan reviewed with family and verbalized understanding.

## 2019-11-12 ENCOUNTER — Telehealth (INDEPENDENT_AMBULATORY_CARE_PROVIDER_SITE_OTHER): Payer: Medicaid Other | Admitting: Obstetrics and Gynecology

## 2019-11-12 DIAGNOSIS — Z1389 Encounter for screening for other disorder: Secondary | ICD-10-CM

## 2019-11-12 NOTE — Progress Notes (Signed)
I connected with@ on 11/12/19 at  2:15 PM EST by: Telephone and verified that I am speaking with the correct person using two identifiers.  Patient is located at [facility] and provider is located at [place].     The purpose of this virtual visit is to provide medical care while limiting exposure to the novel coronavirus. I discussed the limitations, risks, security and privacy concerns of performing an evaluation and management service by MyChart and the availability of in person appointments. I also discussed with the patient that there may be a patient responsible charge related to this service. By engaging in this virtual visit, you consent to the provision of healthcare.  Additionally, you authorize for your insurance to be billed for the services provided during this visit.  The patient expressed understanding and agreed to proceed.  The following staff members participated in the virtual visit:  Anderson Malta Rasch,NP   Post Partum Visit Note Subjective:    Ms. Mary Hays is a 29 y.o. [phone] female who presents for a postpartum visit. She is 4 weeks postpartum following a SVD. I have fully reviewed the prenatal and intrapartum course. The delivery was at 39.0 gestational weeks. Outcome: spontaneous vaginal delivery. Anesthesia: local. Postpartum course has been Unremarkable. Baby's course has been unremarkable. Baby is feeding by bottle - Similac Advance. Bleeding no bleeding. Bowel function is normal. Bladder function is normal. Patient is not sexually active. Contraception method is none. Postpartum depression screening: Negative.  The following portions of the patient's history were reviewed and updated as appropriate: allergies, current medications, past family history, past medical history, past social history, past surgical history and problem list.  Review of Systems Pertinent items are noted in HPI.   Objective:  There were no vitals filed for this visit. Self-Obtained Patient will check BP and  log into babyscripts today. She was in the car-rider line for school pick up at the time of phone call.        Assessment:   Normal postpartum exam. Pap smear not done at today's visit. Last pap smear was 2020 and results were normal. Next pap due 2023.   Second child who is 3 is having monthly fevers X 1 year. 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 Husband was just tested through [place]. Results are pending.   Plan:   1. Contraception: none 2. Doing well- no complaints at this time.  3. Follow up as needed.   8 minutes of non-face-to-face time spent with the patient   Rasch, Artist Pais, NP 11/12/2019 2:29 PM

## 2021-02-23 IMAGING — US OBSTETRIC <14 WK ULTRASOUND
1 series · 15 of 28 positions shown · non-contrast
Comparison: None.

CLINICAL DATA: Pelvic pain.  Unsure of dates

EXAM:
OBSTETRIC <14 WK ULTRASOUND
TECHNIQUE: Transvaginal ultrasound was performed for complete evaluation of the
gestation as well as the maternal uterus, adnexal regions, and
pelvic cul-de-sac.

[Series 1: obstetric <14 wk ultrasound · 15 of 53 slices shown]
[im 1/53]
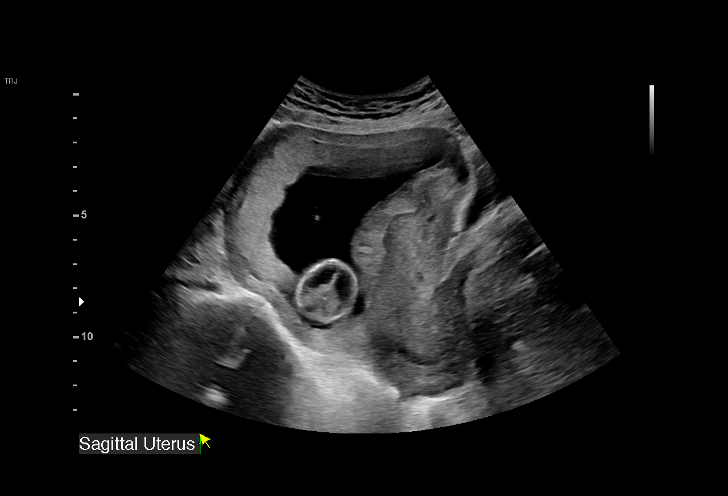
[im 4/53]
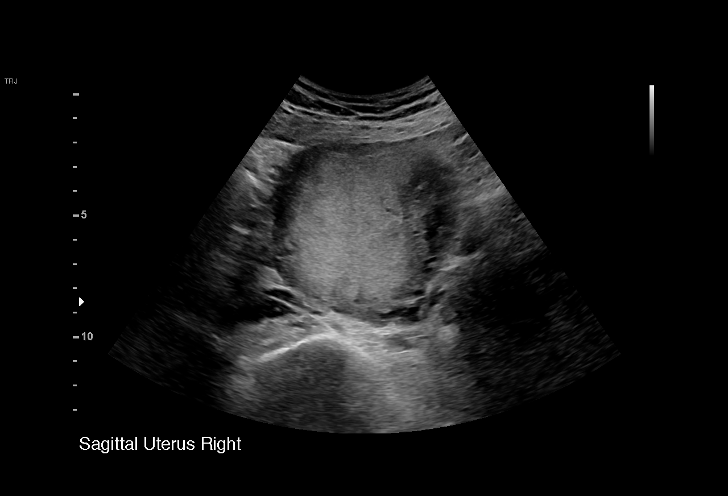
[im 8/53]
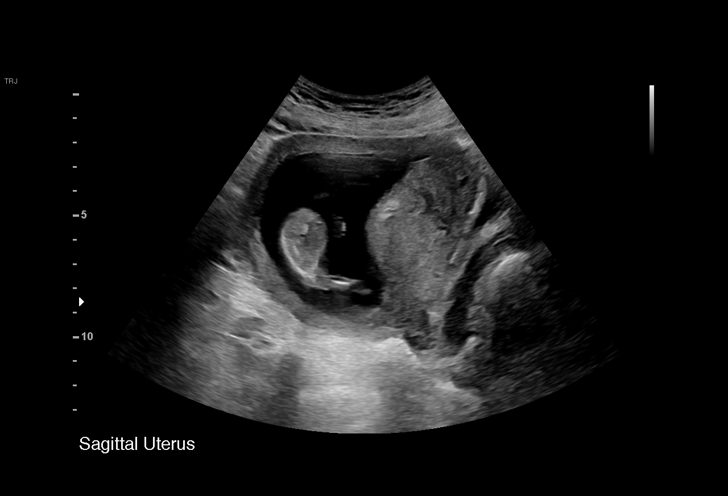
[im 12/53]
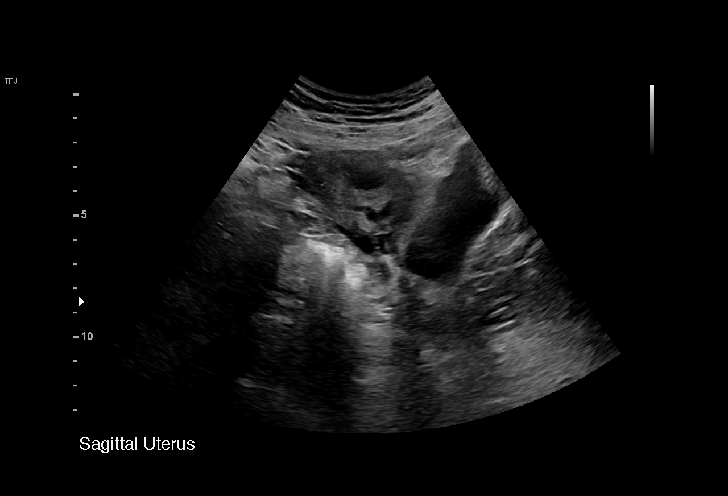
[im 16/53]
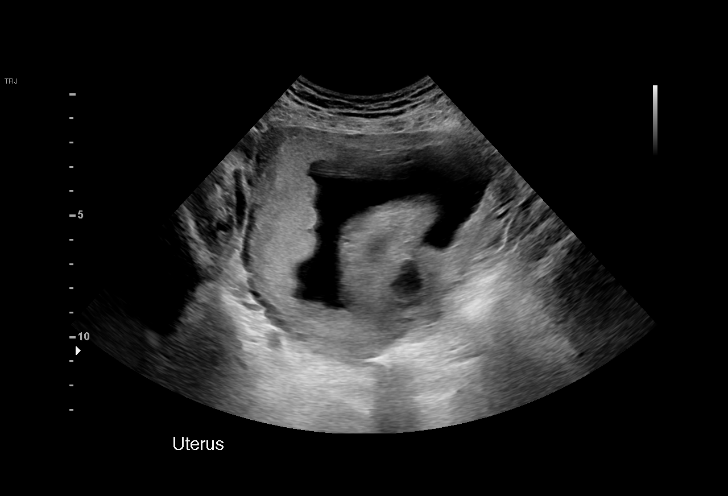
[im 20/53]
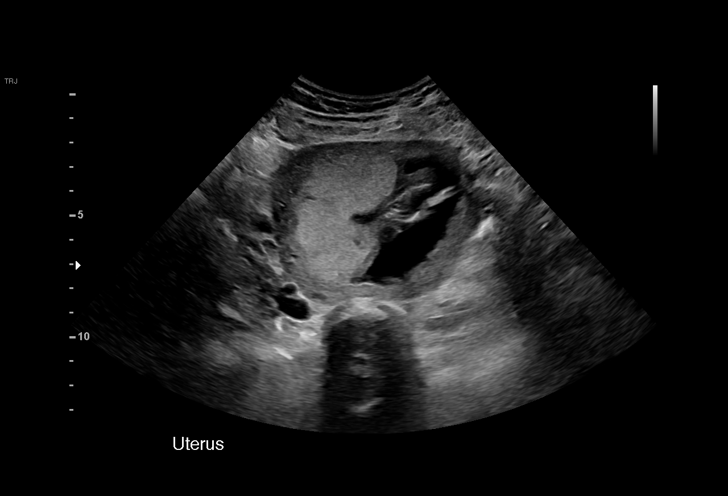
[im 24/53]
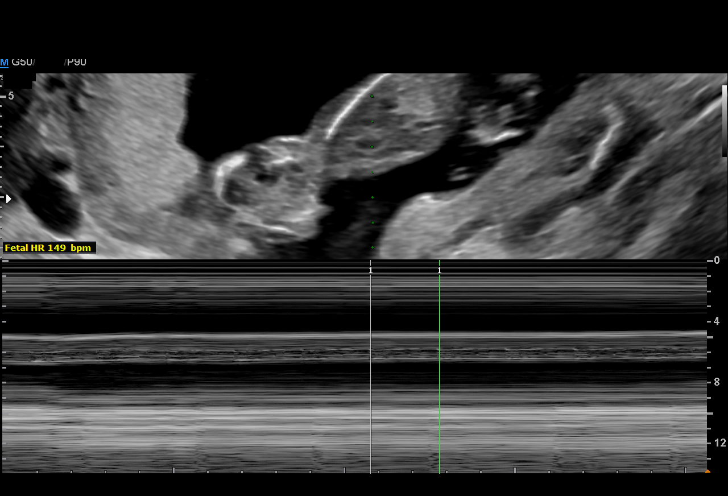
[im 27/53]
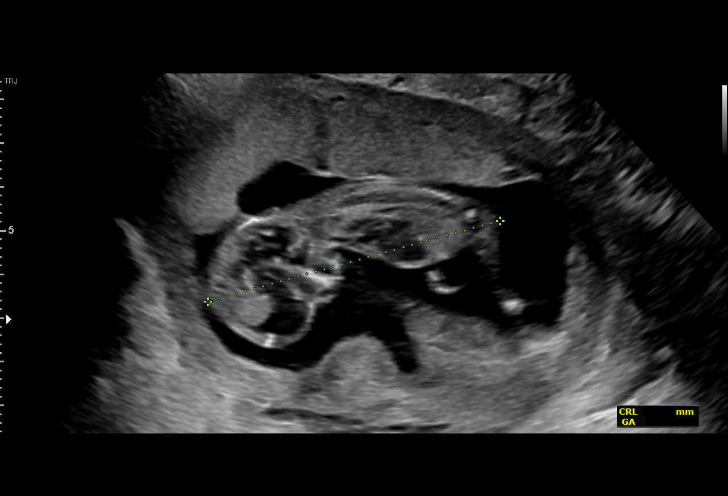
[im 29/53]
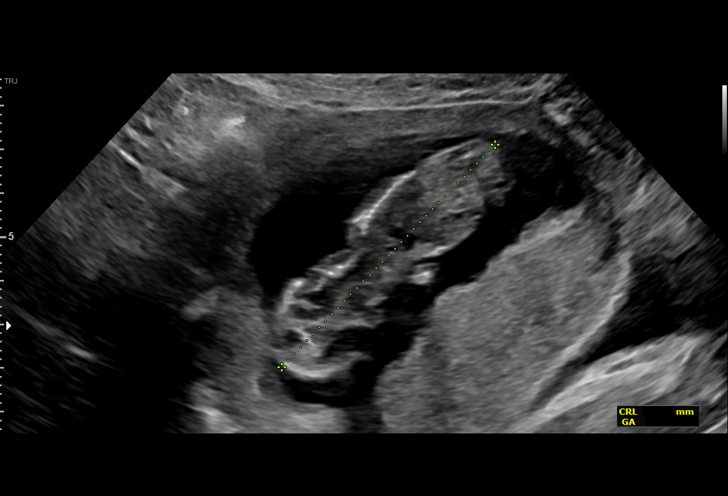
[im 33/53]
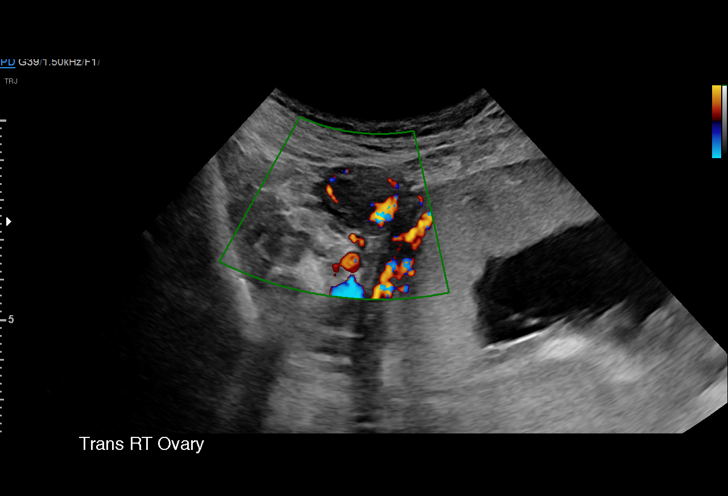
[im 37/53]
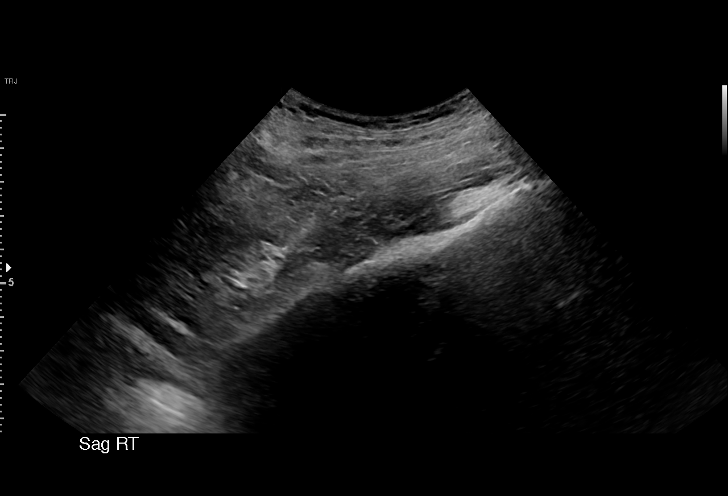
[im 41/53]
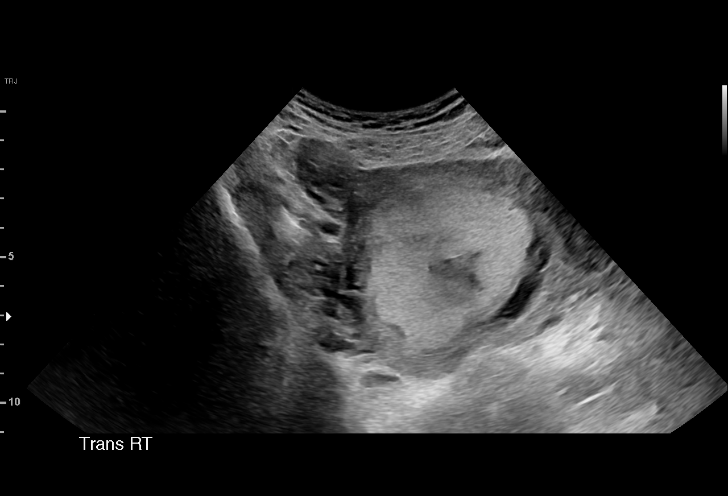
[im 45/53]
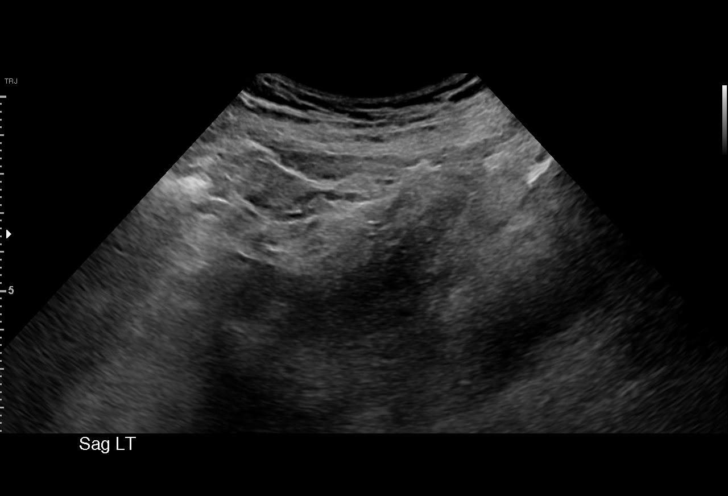
[im 49/53]
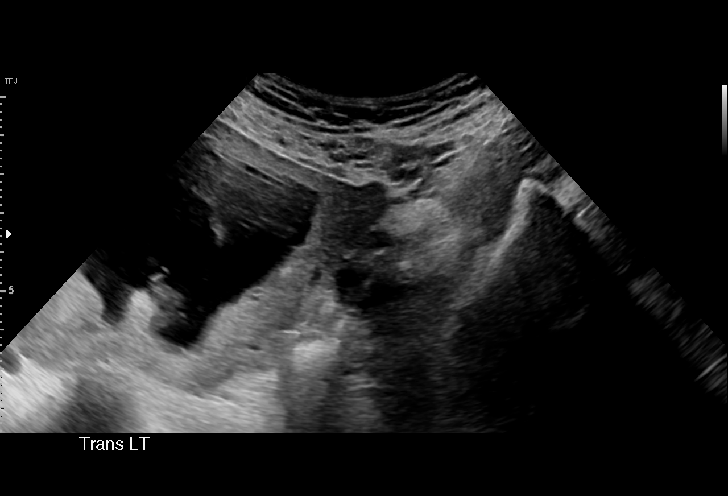
[im 53/53]
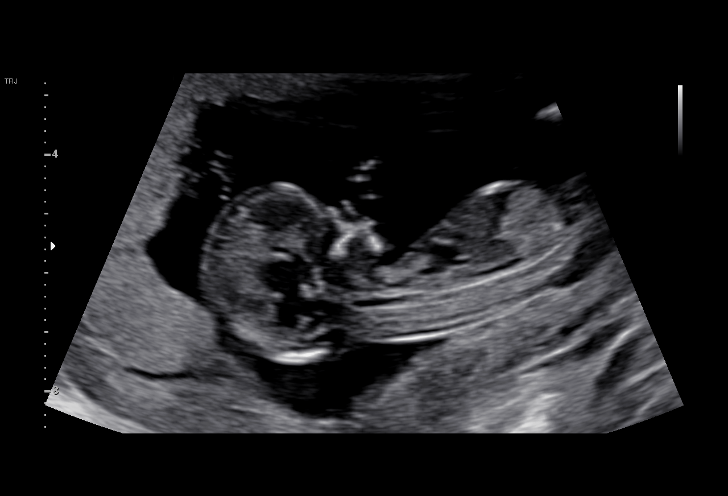

[15 of 28 positions shown; findings below may reference images not displayed]

FINDINGS: Intrauterine gestational sac: Visualized

Yolk sac:  Not visualized

Embryo:  Visualized

Cardiac Activity:   Visualized

Heart Rate: 149 bpm

CRL:   69 mm   13 w 1 d                  US EDC: October 27, 2019

Subchorionic hemorrhage:  None visualized.

Maternal uterus/adnexae: Cervical os is closed. Right ovary measures
4.8 x 1.4 x 2.0 cm. Left ovary measures 3.8 x 1.2 x 2.1 cm. No
extrauterine pelvic mass beyond physiologic corpus luteum on the
right. No free pelvic fluid.
IMPRESSION: Single live intrauterine gestation with estimated gestational age of
approximately 13 weeks. No subchorionic hemorrhage. Study otherwise
unremarkable.

## 2021-04-06 IMAGING — US US MFM OB DETAIL+14 WK
1 series · 13 of 28 positions shown · non-contrast
Comparison: none

[Series 1: us mfm ob detail+14 wk · 105 acquisitions, 13 frames shown]
[im 4/105]
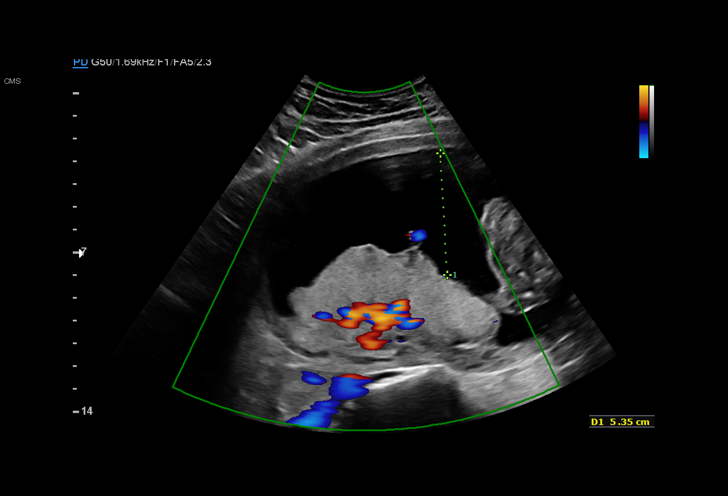
[im 12/105]
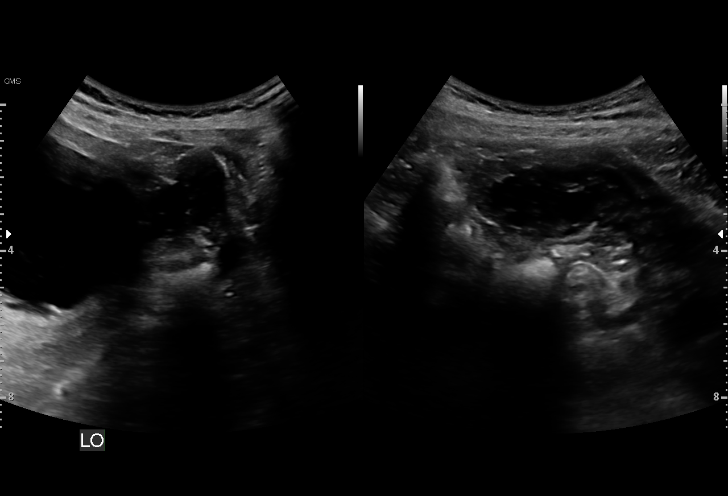
[im 20/105]
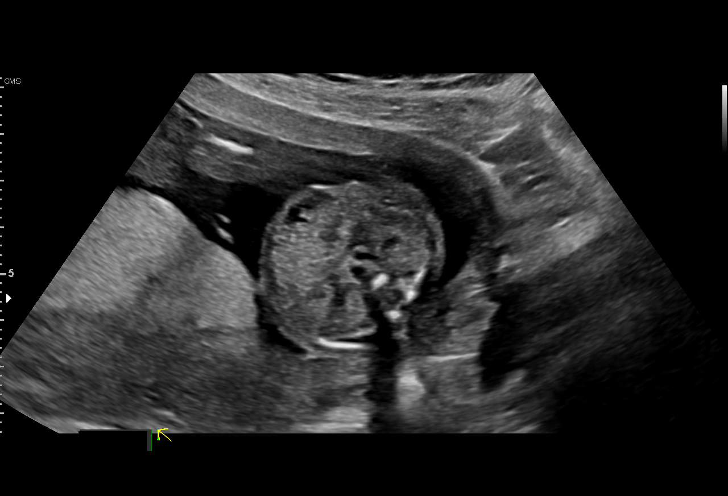
[im 27/105]
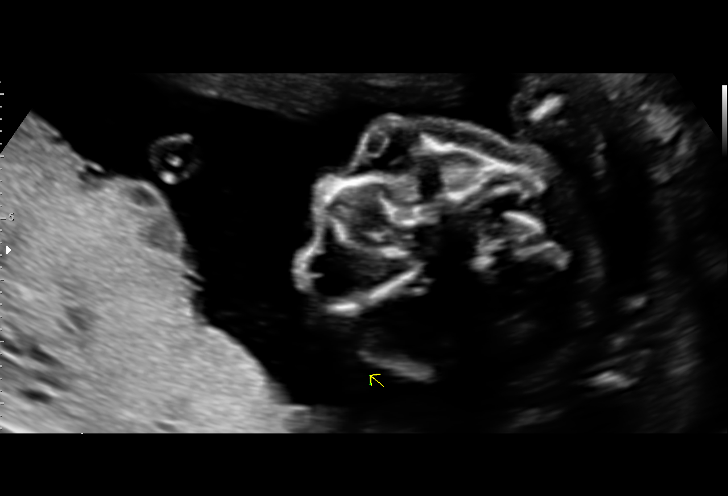
[im 35/105]
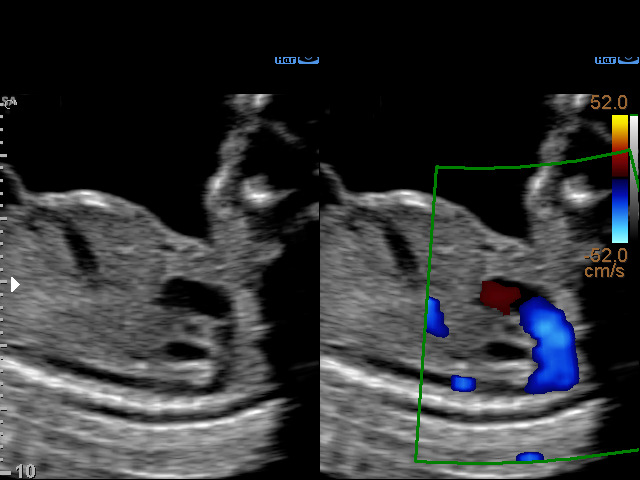
[im 43/105]
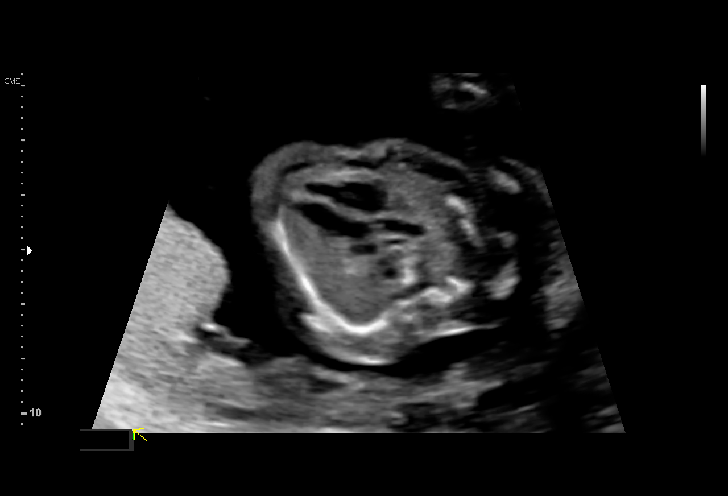
[im 54/105]
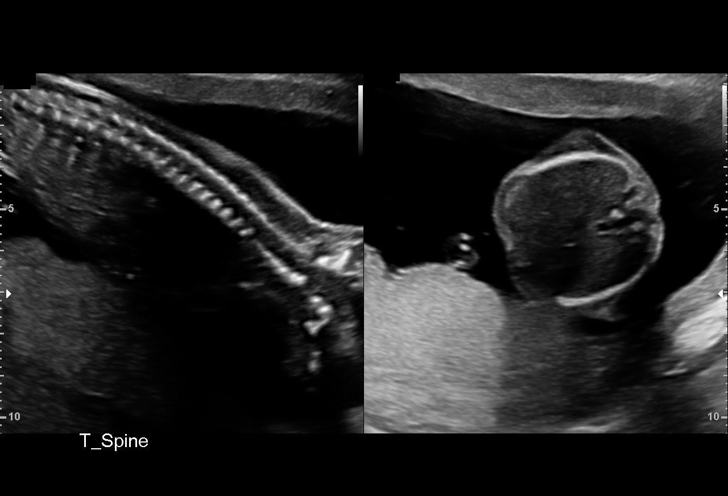
[im 62/105]
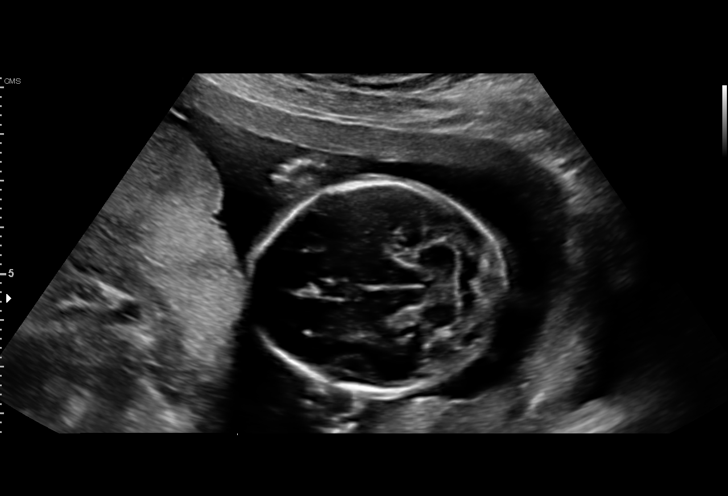
[im 70/105]
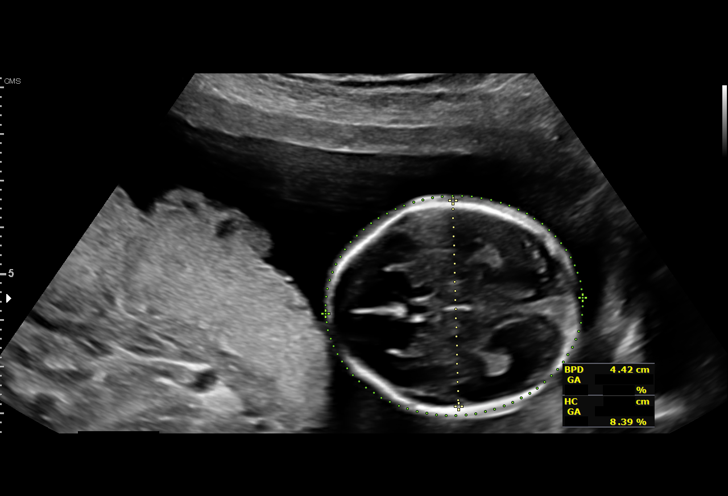
[im 78/105]
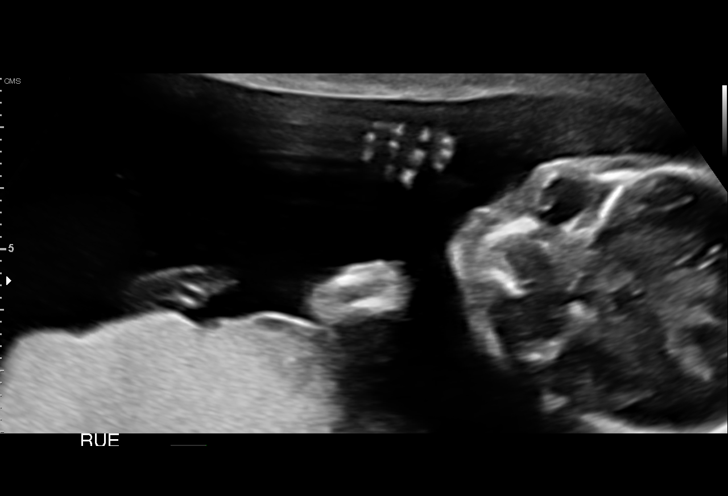
[im 85/105]
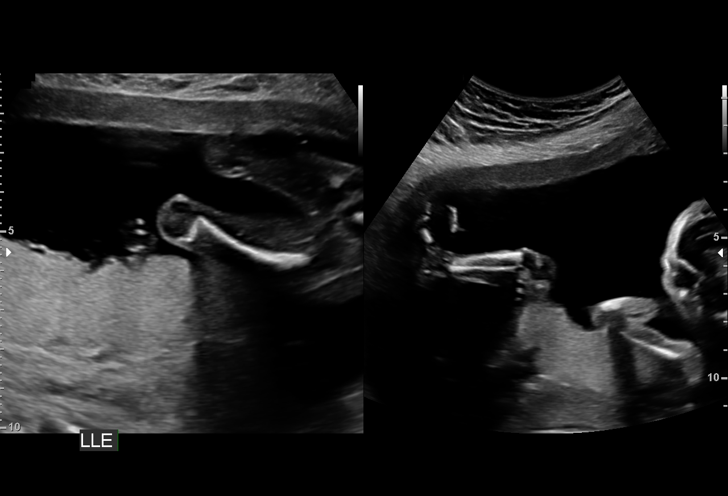
[im 93/105]
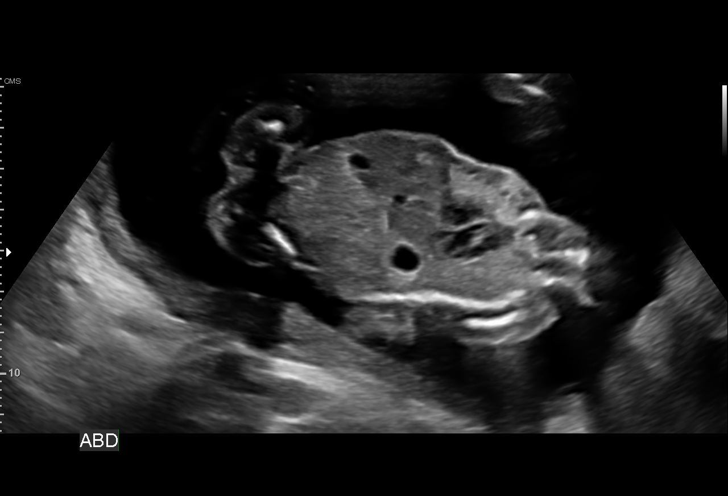
[im 101/105]
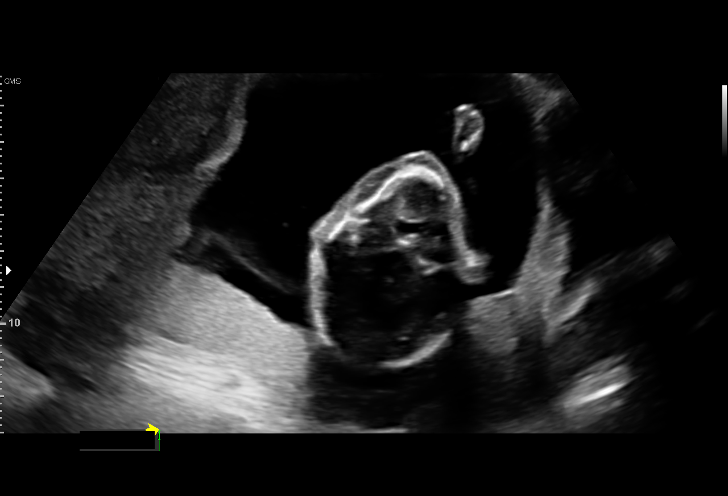

[13 of 28 positions shown; findings below may reference images not displayed]

[address]

 ----------------------------------------------------------------------

 ----------------------------------------------------------------------
Indications

  Encounter for antenatal screening for
  malformations - low risk NIPS
  Genetic carrier (ACADM) FOB declined
  carrier screen
  Maternal care for known or suspected poor
  fetal growth, second trimester, not applicable
  or unspecified IUGR
  19 weeks gestation of pregnancy
 ----------------------------------------------------------------------
Fetal Evaluation

 Num Of Fetuses:          1
 Fetal Heart Rate(bpm):   135
 Cardiac Activity:        Observed
 Presentation:            Cephalic
 Placenta:                Posterior
 P. Cord Insertion:       Visualized

 Amniotic Fluid
 AFI FV:      Within normal limits

                             Largest Pocket(cm)

Biometry

 BPD:      44.1  mm     G. Age:  19w 2d         28  %    CI:        74.48   %    70 - 86
                                                         FL/HC:       16.2  %    16.8 -
 HC:      162.2  mm     G. Age:  19w 0d         10  %    HC/AC:       1.19       1.09 -
 AC:      136.1  mm     G. Age:  19w 0d         20  %    FL/BPD:      59.6  %
 FL:       26.3  mm     G. Age:  18w 0d          2  %    FL/AC:       19.3  %    20 - 24
 HUM:      26.7  mm     G. Age:  18w 3d         16  %
 CER:        20  mm     G. Age:  19w 0d         32  %
 CM:        3.2  mm
 Est. FW:     249   gm     0 lb 9 oz      4  %
OB History

 Gravidity:    3         Term:   2        Prem:   0        SAB:   0
 TOP:          0       Ectopic:  0        Living: 2
Gestational Age

 LMP:           19w 6d        Date:  01/15/19                 EDD:   10/22/19
 U/S Today:     18w 6d                                        EDD:   10/29/19
 Best:          19w 6d     Det. By:  LMP  (01/15/19)          EDD:   10/22/19
Anatomy

 Cranium:               Appears normal         LVOT:                   Appears normal
 Cavum:                 Appears normal         Aortic Arch:            Appears normal
 Ventricles:            Appears normal         Ductal Arch:            Appears normal
 Choroid Plexus:        Appears normal         Diaphragm:              Appears normal
 Cerebellum:            Appears normal         Stomach:                Appears normal, left
                                                                       sided
 Posterior Fossa:       Appears normal         Abdominal Wall:         Appears nml (cord
                                                                       insert, abd wall)
 Nuchal Fold:           Appears normal         Cord Vessels:           Appears normal (3
                                                                       vessel cord)
 Face:                  Appears normal         Kidneys:                Appear normal
                        (orbits and profile)
 Lips:                  Appears normal         Bladder:                Appears normal
 Palate:                Appears normal         Spine:                  Appears normal
 Thoracic:              Appears normal         Upper Extremities:      Appears normal
 Heart:                 Appears normal         Lower Extremities:      Appears normal
                        (4CH, axis, and
                        situs)
 RVOT:                  Appears normal

 Other:  Heels and 5th digits visualized. Nasal bone visualized.
Cervix Uterus Adnexa

 Cervix
 Length:              3  cm.
 Normal appearance by transabdominal scan.

 Uterus
 Normal shape and size.

 Left Ovary
 Within normal limits.

 Right Ovary
 Within normal limits.

 Cul De Sac
 No free fluid seen.

 Adnexa
 No abnormality visualized.
Impression

 Fetal growth restriction noted today
 Low risk NIPS
 Good fetal movement and amniotic fluid
Recommendations

 Follow up growth in 3 weeks
 Please collect TORCH titers at your next visit
 MFM consultation if growth trend persist.

## 2021-05-30 IMAGING — US US MFM UA CORD DOPPLER
1 series · 15 of 19 positions shown · non-contrast
Comparison: none

[Series 1: us mfm ua cord doppler · 19 acquisitions, 15 frames shown]
[im 1/19]
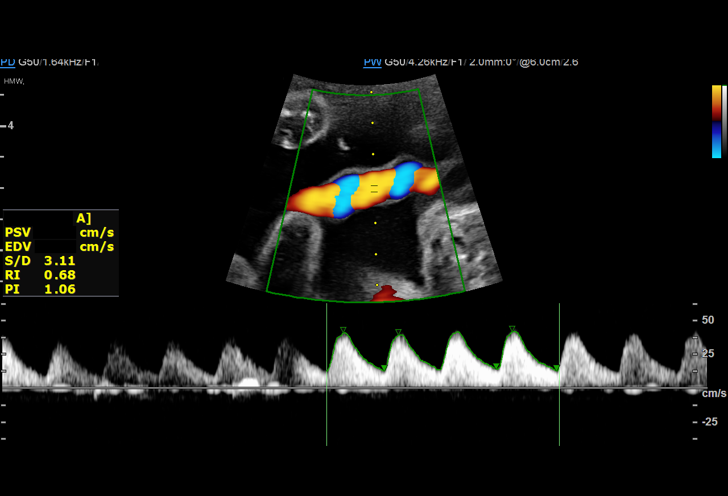
[im 2/19]
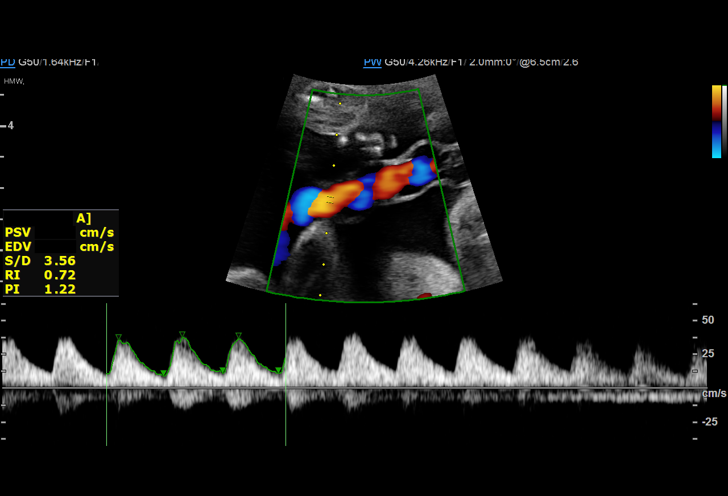
[im 4/19]
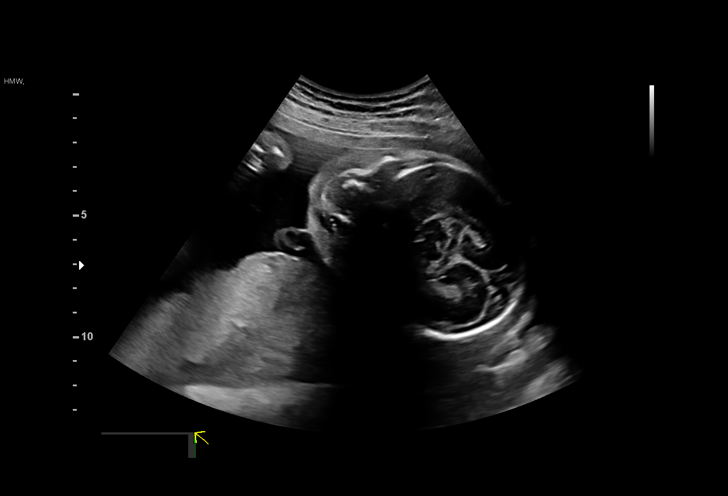
[im 5/19]
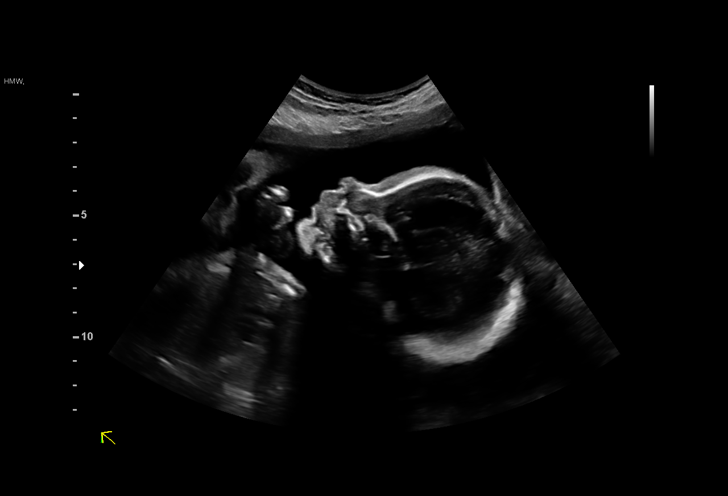
[im 6/19]
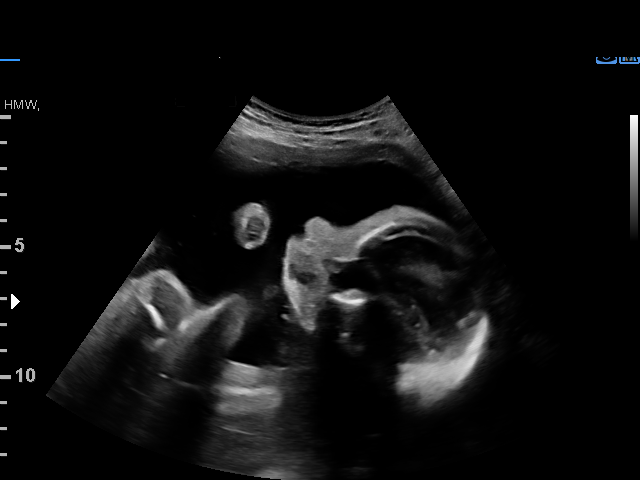
[im 7/19]
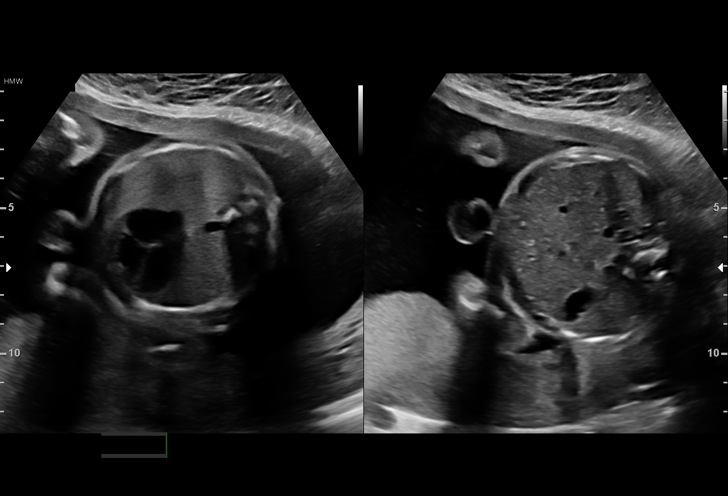
[im 9/19]
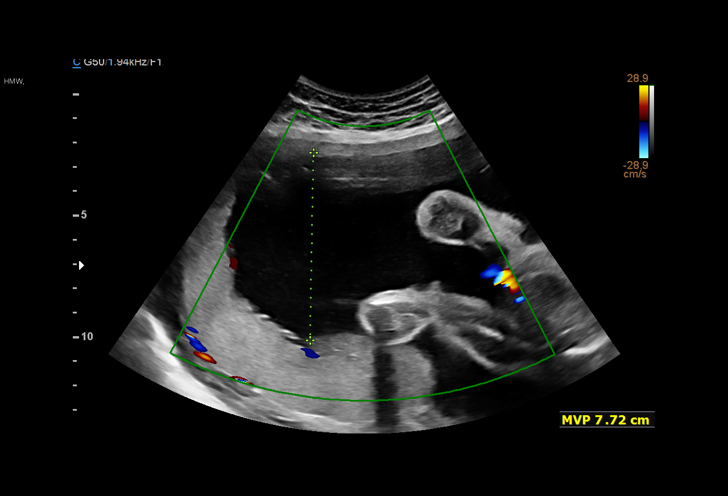
[im 10/19]
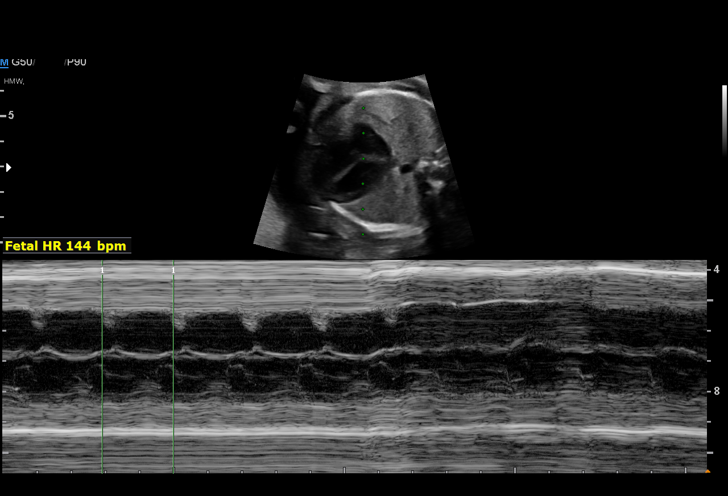
[im 11/19]
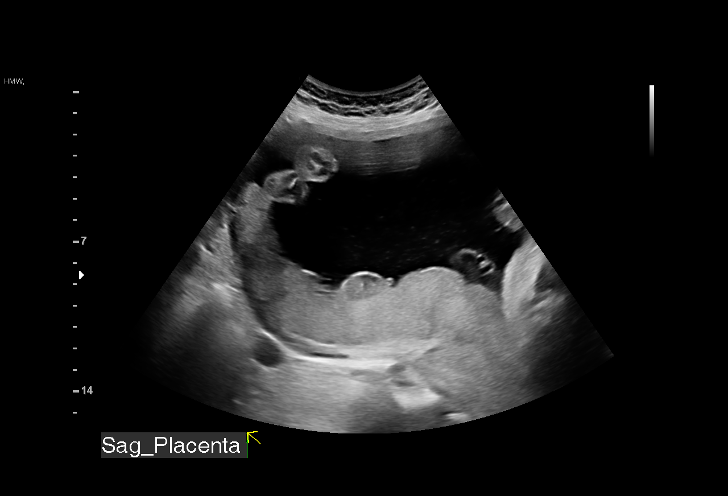
[im 13/19]
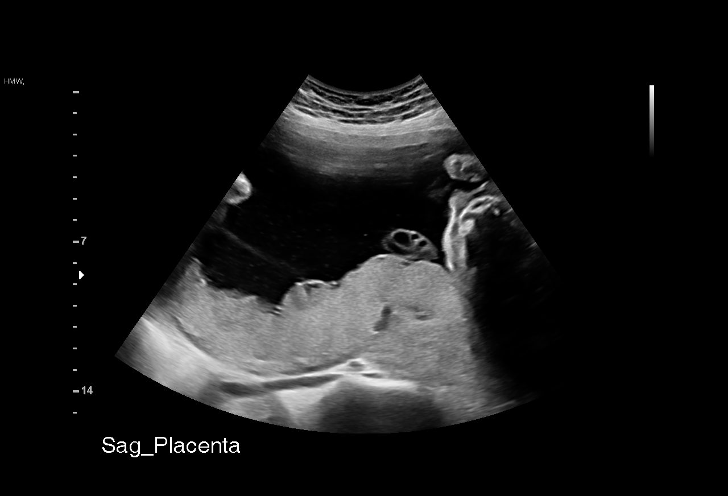
[im 14/19]
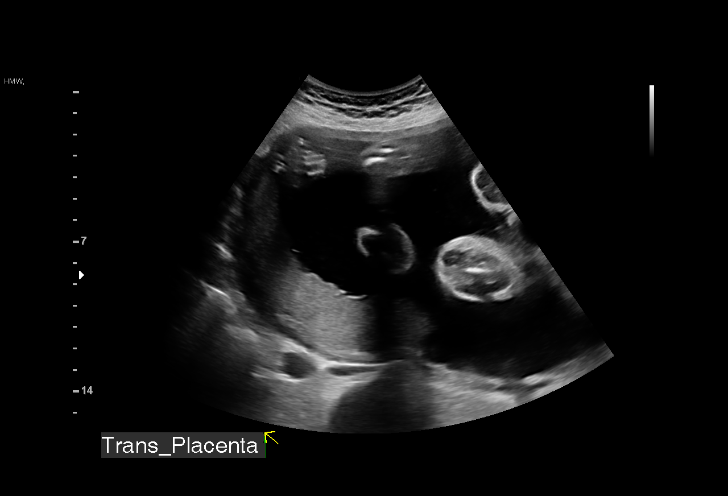
[im 15/19]
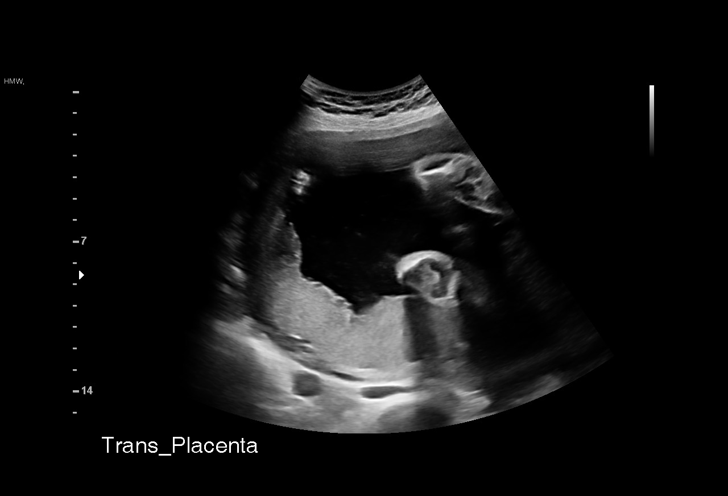
[im 16/19]
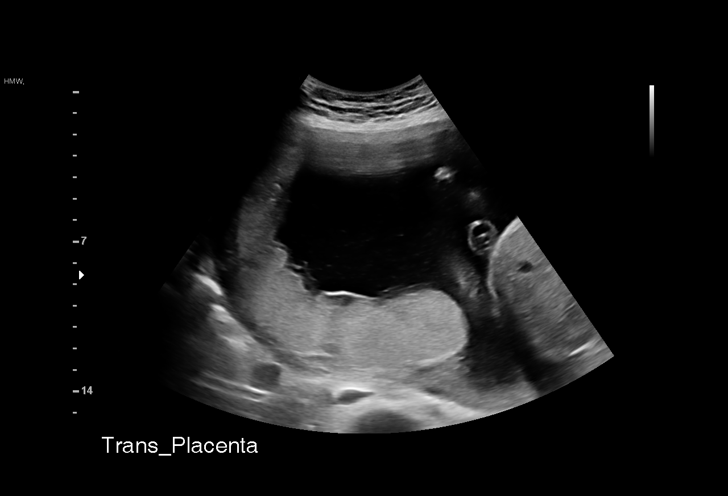
[im 18/19]
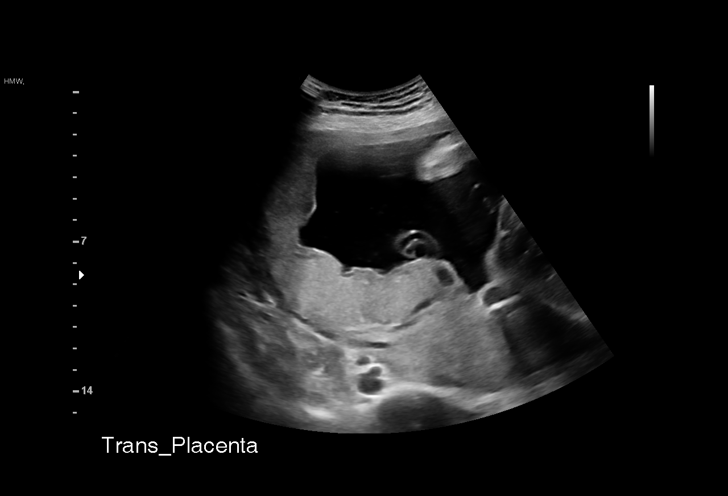
[im 19/19]
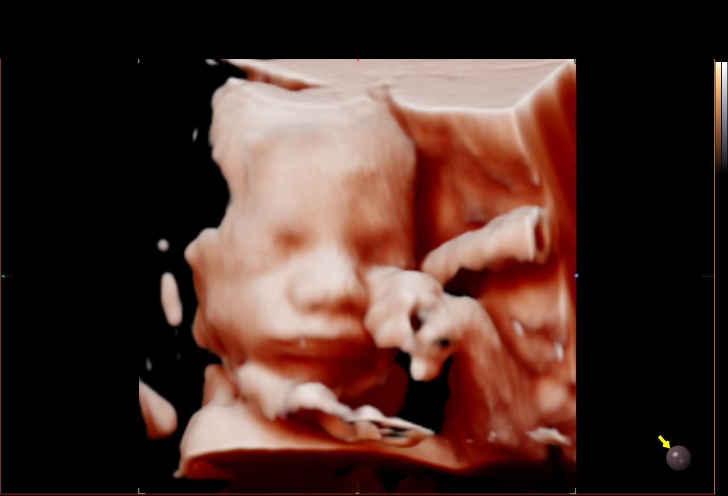

[15 of 19 positions shown; findings below may reference images not displayed]

[address]

 ----------------------------------------------------------------------

 ----------------------------------------------------------------------
Indications

  27 weeks gestation of pregnancy
  Maternal care for known or suspected poor
  fetal growth, second trimester, not applicable
  or unspecified IUGR
  Genetic carrier (ACADM) FOB declined
  carrier screen
 ----------------------------------------------------------------------
Fetal Evaluation

 Num Of Fetuses:         1
 Fetal Heart Rate(bpm):  144
 Cardiac Activity:       Observed
 Presentation:           Cephalic
 Placenta:               Posterior
 P. Cord Insertion:      Previously Visualized

 Amniotic Fluid
 AFI FV:      Within normal limits

                             Largest Pocket(cm)

OB History

 Gravidity:    3         Term:   2        Prem:   0        SAB:   0
 TOP:          0       Ectopic:  0        Living: 2
Gestational Age

 LMP:           27w 4d        Date:  01/15/19                 EDD:   10/22/19
 Best:          27w 4d     Det. By:  LMP  (01/15/19)          EDD:   10/22/19
Anatomy
 Thoracic:              Appears normal         Bladder:                Appears normal
 Stomach:               Appears normal, left
                        sided
Doppler - Fetal Vessels

 Umbilical Artery
  S/D     %tile     RI              PI                     ADFV    RDFV
  3.2       57   0.69             1.12                         N       N

Cervix Uterus Adnexa

 Cervix
 Not visualized (advanced GA >57wks)
Impression

 Fetal growth restriction suspected on early ultrasound.
 Subsequently, on ultrasound performed 12 days ago, the
 estimated fetal weight was at the 16th percentile. CMV and
 toxo screening are negative.

 Amniotic fluid is normal and good fetal activity is seen.
 Umbilical artery Doppler showed normal forward diastolic
 flow.
Recommendations

 -Fetal growth and UA Doppler in 2 weeks.
 -If fetal growth restriction is not seen, UA Doppler may be
 discontinued.
                 Hartman, Fracisco

## 2021-07-27 IMAGING — US US MFM OB FOLLOW-UP
1 series · 13 of 28 positions shown · non-contrast
Comparison: none

[Series 1: us mfm ob follow-up · 43 acquisitions, 13 frames shown]
[im 2/43]
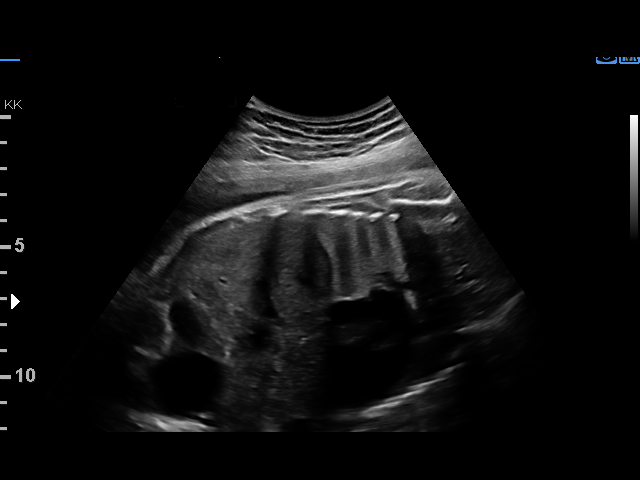
[im 5/43]
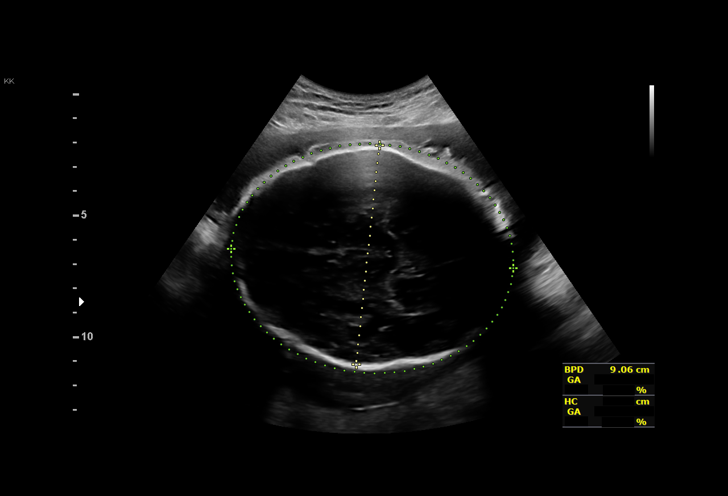
[im 8/43]
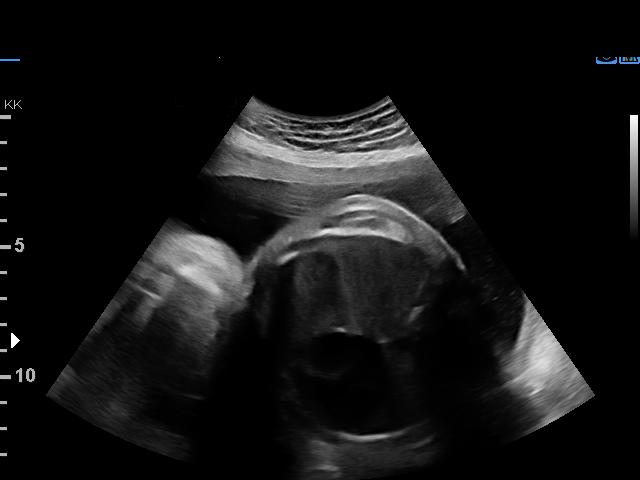
[im 11/43]
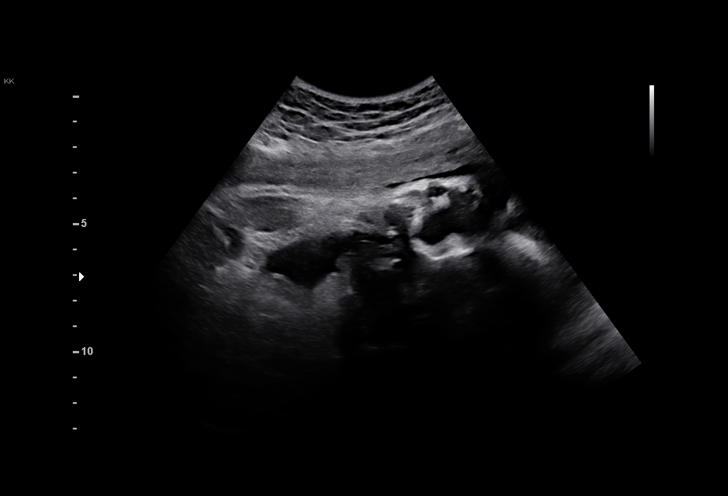
[im 15/43]
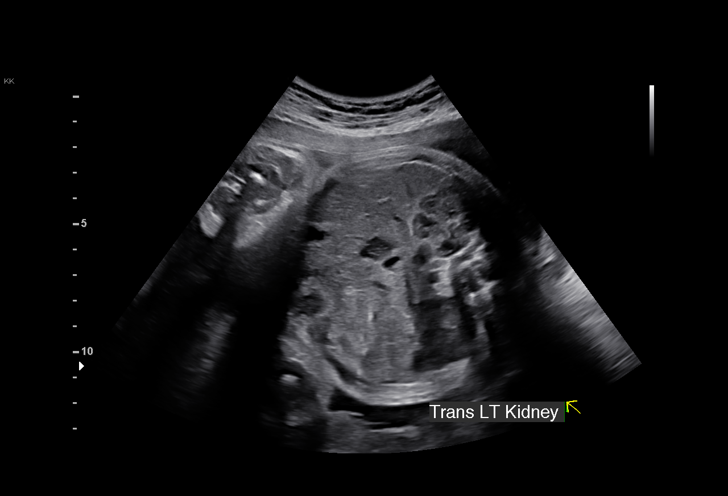
[im 18/43]
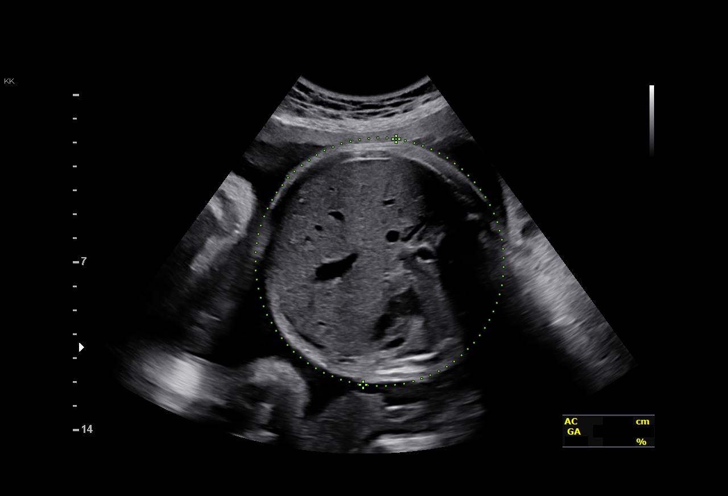
[im 22/43]
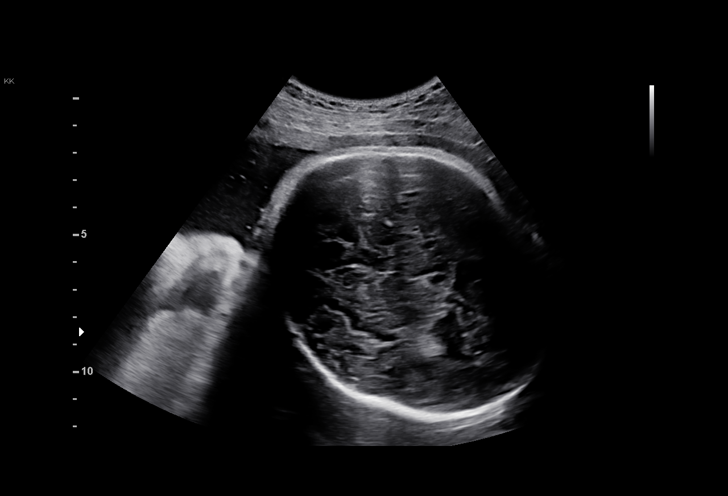
[im 25/43]
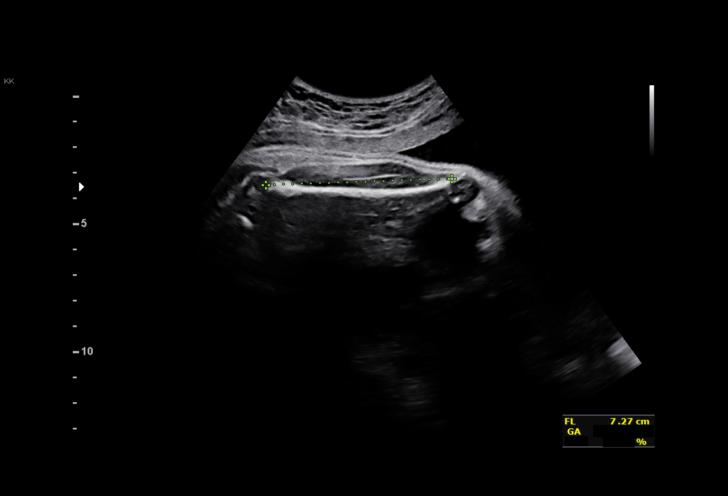
[im 29/43]
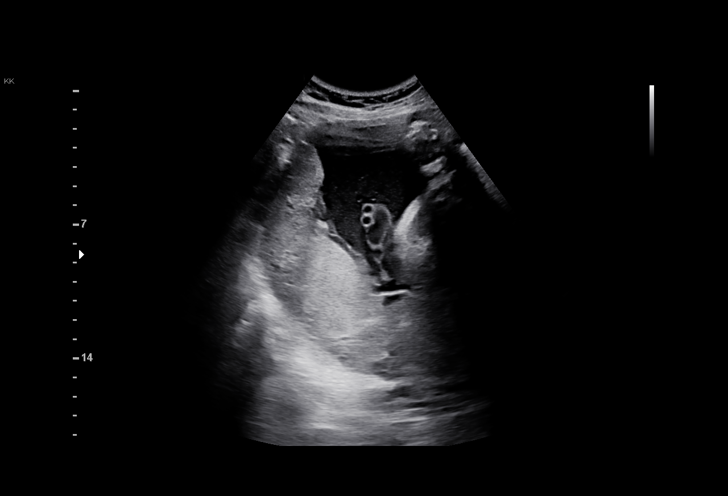
[im 32/43]
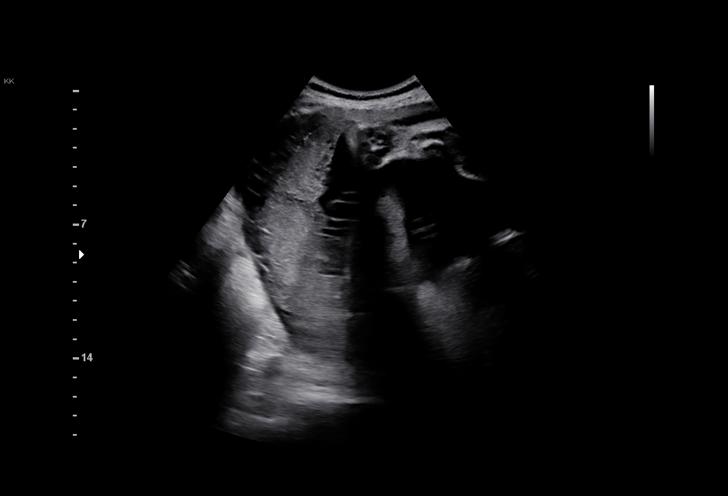
[im 35/43]
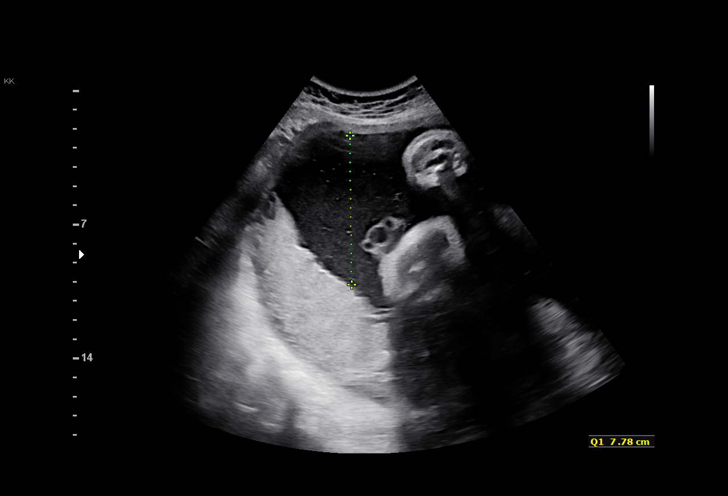
[im 38/43]
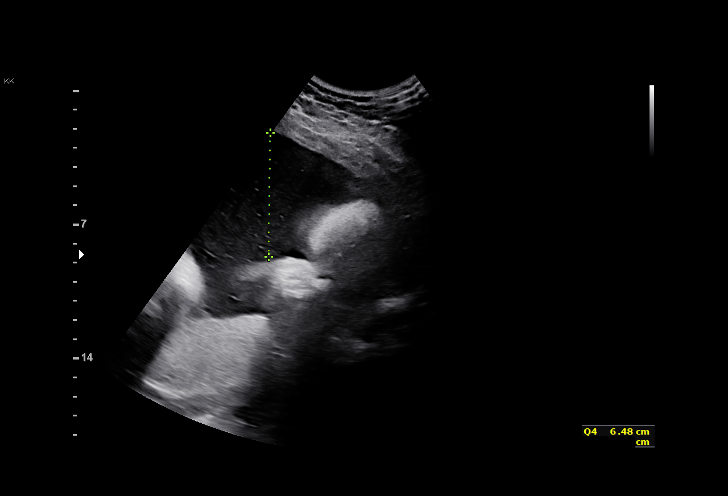
[im 41/43]
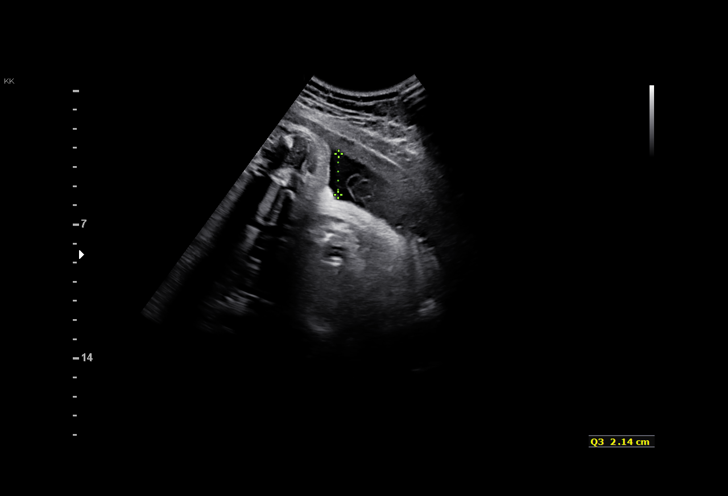

[13 of 28 positions shown; findings below may reference images not displayed]

[address]

 ----------------------------------------------------------------------

 ----------------------------------------------------------------------
Indications

  Maternal care for known or suspected poor
  fetal growth, third trimester, not applicable or
  unspecified IUGR
  Polyhydramnios, third trimester, antepartum
  condition or complication, unspecified fetus
  Encounter for other antenatal screening
  follow-up (low risk NIPS)
  Genetic carrier (ACADM) FOB declined
  carrier screen
  35 weeks gestation of pregnancy
 ----------------------------------------------------------------------
Fetal Evaluation

 Num Of Fetuses:         1
 Fetal Heart Rate(bpm):  141
 Cardiac Activity:       Observed
 Presentation:           Cephalic
 Placenta:               Posterior
 P. Cord Insertion:      Previously Visualized

 Amniotic Fluid
 AFI FV:      Polyhydramnios

 AFI Sum(cm)     %Tile       Largest Pocket(cm)
 24.6            94

 RUQ(cm)       RLQ(cm)       LUQ(cm)        LLQ(cm)

Biometry

 BPD:      90.2  mm     G. Age:  36w 4d         76  %    CI:        76.18   %    70 - 86
                                                         FL/HC:      21.8   %    20.1 -
 HC:      327.5  mm     G. Age:  37w 1d         52  %    HC/AC:      1.01        0.93 -
 AC:       324   mm     G. Age:  36w 2d         73  %    FL/BPD:     79.2   %    71 - 87
 FL:       71.4  mm     G. Age:  36w 4d         64  %    FL/AC:      22.0   %    20 - 24

 Est. FW:    1494  gm      6 lb 8 oz     69  %
OB History

 Gravidity:    3         Term:   2        Prem:   0        SAB:   0
 TOP:          0       Ectopic:  0        Living: 2
Gestational Age

 LMP:           35w 6d        Date:  01/15/19                 EDD:   10/22/19
 U/S Today:     36w 5d                                        EDD:   10/16/19
 Best:          35w 6d     Det. By:  LMP  (01/15/19)          EDD:   10/22/19
Anatomy

 Cranium:               Appears normal         LVOT:                   Previously seen
 Cavum:                 Previously seen        Aortic Arch:            Previously seen
 Ventricles:            Appears normal         Ductal Arch:            Previously seen
 Choroid Plexus:        Previously seen        Diaphragm:              Appears normal
 Cerebellum:            Previously seen        Stomach:                Appears normal, left
                                                                       sided
 Posterior Fossa:       Previously seen        Abdomen:                Previously seen
 Nuchal Fold:           Previously seen        Abdominal Wall:         Previously seen
 Face:                  Orbits and profile     Cord Vessels:           Previously seen
                        previously seen
 Lips:                  Appears normal         Kidneys:                Appear normal
 Palate:                Previously seen        Bladder:                Appears normal
 Thoracic:              Appears normal         Spine:                  Previously seen
 Heart:                 Previously seen        Upper Extremities:      Previously seen
 RVOT:                  Previously seen        Lower Extremities:      Previously seen

 Other:  Heels and 5th digits previously visualized. Nasal bone previously
         visualized.
Cervix Uterus Adnexa

 Cervix
 Not visualized (advanced GA >93wks)
Comments

 This patient was seen for a follow up growth scan due to fetal
 growth restriction noted earlier in her pregnancy.  The patient
 reports that she has been experiencing bilateral sciatic pain
 for the past few weeks.  She has tried both hot and cold
 compresses along with Tylenol without much relief of the
 sciatic pain.
 She was informed that the fetal growth appears appropriate
 for her gestational age.  The fetus was not growth restricted
 today.  Borderline polyhydramnios with a total AFI of over 24
 cm was noted today.
 The patient was advised that unfortunately,delivery is the only
 relief for the sciatic pain that she is experiencing.  She was
 advised that an induction may be scheduled at 39 weeks for
 relief of her sciatic pain.

## 2022-07-17 ENCOUNTER — Other Ambulatory Visit (HOSPITAL_BASED_OUTPATIENT_CLINIC_OR_DEPARTMENT_OTHER): Payer: Self-pay | Admitting: *Deleted

## 2022-07-17 ENCOUNTER — Encounter (HOSPITAL_BASED_OUTPATIENT_CLINIC_OR_DEPARTMENT_OTHER): Payer: Self-pay

## 2022-07-17 DIAGNOSIS — Z3480 Encounter for supervision of other normal pregnancy, unspecified trimester: Secondary | ICD-10-CM | POA: Insufficient documentation

## 2022-07-18 ENCOUNTER — Ambulatory Visit (INDEPENDENT_AMBULATORY_CARE_PROVIDER_SITE_OTHER): Payer: Medicaid Other | Admitting: *Deleted

## 2022-07-18 ENCOUNTER — Other Ambulatory Visit (HOSPITAL_COMMUNITY)
Admission: RE | Admit: 2022-07-18 | Discharge: 2022-07-18 | Disposition: A | Discharge status: Home / Self Care | Payer: Medicaid Other | Source: Ambulatory Visit | Attending: Obstetrics & Gynecology | Admitting: Obstetrics & Gynecology

## 2022-07-18 ENCOUNTER — Ambulatory Visit (INDEPENDENT_AMBULATORY_CARE_PROVIDER_SITE_OTHER): Payer: Medicaid Other

## 2022-07-18 ENCOUNTER — Other Ambulatory Visit (HOSPITAL_BASED_OUTPATIENT_CLINIC_OR_DEPARTMENT_OTHER): Payer: Self-pay | Admitting: *Deleted

## 2022-07-18 ENCOUNTER — Encounter (HOSPITAL_BASED_OUTPATIENT_CLINIC_OR_DEPARTMENT_OTHER): Payer: Self-pay

## 2022-07-18 VITALS — BP 111/51 | HR 66 | Wt 133.6 lb

## 2022-07-18 DIAGNOSIS — O3680X1 Pregnancy with inconclusive fetal viability, fetus 1: Secondary | ICD-10-CM | POA: Diagnosis not present

## 2022-07-18 DIAGNOSIS — Z3480 Encounter for supervision of other normal pregnancy, unspecified trimester: Secondary | ICD-10-CM | POA: Insufficient documentation

## 2022-07-18 DIAGNOSIS — Z3481 Encounter for supervision of other normal pregnancy, first trimester: Secondary | ICD-10-CM

## 2022-07-18 DIAGNOSIS — O3680X0 Pregnancy with inconclusive fetal viability, not applicable or unspecified: Secondary | ICD-10-CM

## 2022-07-18 DIAGNOSIS — Z3A08 8 weeks gestation of pregnancy: Secondary | ICD-10-CM

## 2022-07-18 NOTE — Progress Notes (Signed)
New OB Intake  I explained I am completing New OB Intake today. We discussed EDD of 02/26/23 that is based on LMP of 05/22/22. Pt is G5/P3. I reviewed her allergies, medications, Medical/Surgical/OB history, and appropriate screenings. Based on history, this is a low risk pregnancy.  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 Patient given information for [facility] Healthy Baby website for more information about Women's and [place]. Patient is not interested in water birth.  MyChart/Babyscripts MyChart access verified. I explained pt will have some visits in office and some virtually. Babyscripts instructions given and order placed.  Blood Pressure Cuff/Weight Scale Pt has BP cuff at home. Explained after first prenatal appt pt will check weekly and document in 53.   Anatomy US Explained first scheduled [place] will be around 19 weeks. Anatomy US scheduled for 10/03/21 at 1030. Pt notified to arrive at 1015.  Labs Discussed Johnsie Cancel genetic screening with patient. Would like Panorama  drawn at new OB visit. Horizon has already been done in the past. Routine prenatal labs needed.   Social Determinants of Health Food Insecurity: Patient denies food insecurity. Transportation: Patient denies transportation needs. Childcare: Discussed no children allowed at ultrasound appointments.    Blenda Nicely, RN 07/18/2022  8:34 PM

## 2022-07-19 LAB — CERVICOVAGINAL ANCILLARY ONLY
Chlamydia: NEGATIVE
Comment: NEGATIVE
Comment: NORMAL
Neisseria Gonorrhea: NEGATIVE

## 2022-07-19 LAB — CBC
Hematocrit: 37.7 % (ref 34.0–46.6)
Hemoglobin: 12.4 g/dL (ref 11.1–15.9)
MCH: 25.8 pg — ABNORMAL LOW (ref 26.6–33.0)
MCHC: 32.9 g/dL (ref 31.5–35.7)
MCV: 79 fL (ref 79–97)
Platelets: 265 10*3/uL (ref 150–450)
RBC: 4.8 x10E6/uL (ref 3.77–5.28)
RDW: 17.1 % — ABNORMAL HIGH (ref 11.7–15.4)
WBC: 10.7 10*3/uL (ref 3.4–10.8)

## 2022-07-19 LAB — HEPATITIS B SURFACE ANTIGEN: Hepatitis B Surface Ag: NEGATIVE

## 2022-07-19 LAB — HIV ANTIBODY (ROUTINE TESTING W REFLEX): HIV Screen 4th Generation wRfx: NONREACTIVE

## 2022-07-19 LAB — RPR: RPR Ser Ql: NONREACTIVE

## 2022-07-19 LAB — RUBELLA SCREEN: Rubella Antibodies, IGG: 5.38 index (ref >0.99)

## 2022-07-19 LAB — ABO/RH: Rh Factor: POSITIVE

## 2022-07-19 LAB — HEPATITIS C ANTIBODY: Hep C Virus Ab: NONREACTIVE

## 2022-07-19 LAB — ANTIBODY SCREEN: Antibody Screen: NEGATIVE

## 2022-07-20 LAB — URINE CULTURE

## 2022-07-31 ENCOUNTER — Ambulatory Visit (INDEPENDENT_AMBULATORY_CARE_PROVIDER_SITE_OTHER): Payer: Medicaid Other | Admitting: Advanced Practice Midwife

## 2022-07-31 ENCOUNTER — Encounter (HOSPITAL_BASED_OUTPATIENT_CLINIC_OR_DEPARTMENT_OTHER): Payer: Self-pay | Admitting: Advanced Practice Midwife

## 2022-07-31 VITALS — BP 116/66 | HR 69 | Wt 134.0 lb

## 2022-07-31 DIAGNOSIS — Z148 Genetic carrier of other disease: Secondary | ICD-10-CM

## 2022-07-31 DIAGNOSIS — Z3A10 10 weeks gestation of pregnancy: Secondary | ICD-10-CM | POA: Diagnosis not present

## 2022-07-31 DIAGNOSIS — Z3481 Encounter for supervision of other normal pregnancy, first trimester: Secondary | ICD-10-CM

## 2022-07-31 NOTE — Progress Notes (Signed)
Subjective:   Mary Hays is a 31 y.o. [id number] at 69w0dby LMP being seen today for her first obstetrical visit.  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 Patient does intend to breast feed. Pregnancy history fully reviewed.  Patient reports no complaints.  HISTORY: OB History  Gravida Para Term Preterm AB Living  '5 3 3 '$ 0 1 3  SAB IAB Ectopic Multiple Live Births  1 0 0 0 3    # Outcome Date GA Lbr Len/2nd Weight Sex Delivery Anes PTL Lv  5 Current           4 Term 10/15/19 350w0d1:40 / 00:02 7 lb 0.7 oz (3.195 kg) F Vag-Spont Local  LIV     Name: MUKERSTEN, SALMONS   Apgar1: 9  Apgar5: 9  3 Term 04/14/16 4029w6d:15 / 00:22 8 lb 6.6 oz (3.815 kg) M Vag-Spont None  LIV     Name: Meding,BOY Greg     Apgar1: 4  Apgar5: 7  2 SAB 07/01/15          1 Term 03/13/13 40w60w0dlb 8 oz (2.948 kg) M Vag-Spont EPI  LIV     Name: Odaie   Past Medical History:  Diagnosis Date   Medical history non-contributory    Past Surgical History:  Procedure Laterality Date   APPENDECTOMY     14 y29 old   Family History  Problem Relation Age of Onset   Diabetes Father    Intellectual disability Sister    Down syndrome Sister    Social History   Tobacco Use   Smoking status: Never   Smokeless tobacco: Never  Vaping Use   Vaping Use: Never used  Substance Use Topics   Alcohol use: No   Drug use: No   No Known Allergies Current Outpatient Medications on File Prior to Visit  Medication Sig Dispense Refill   acetaminophen (TYLENOL) 325 MG tablet Take 2 tablets (650 mg total) by mouth every 6 (six) hours as needed (for pain  scale < 4). 30 tablet 0   Ascorbic Acid (VITAMIN C GUMMIE PO) Take 2 each by mouth daily.     Prenatal Vit-Fe Fumarate-FA (PRENATAL MULTIVITAMIN) TABS tablet Take 1 tablet by mouth daily.      No current facility-administered medications on file prior to visit.     Indications for ASA therapy (per uptodate) One of the following: Previous pregnancy with preeclampsia, especially early onset and with an adverse outcome No Multifetal gestation No Chronic hypertension No Type 1 or 2 diabetes mellitus No Chronic kidney disease No Autoimmune disease (antiphospholipid syndrome, systemic lupus erythematosus) No   Two or more of the following: Nulliparity No Obesity (body mass index >30 kg/m2) No Family history of preeclampsia in mother or sister No Age ?35 years No Sociodemographic characteristics (African American race, low socioeconomic level) No Personal risk factors (eg, previous pregnancy with low birth weight or small for gestational age infant, previous adverse pregnancy outcome [eg, stillbirth], interval >10 years between pregnancies) No   Indications for early 1 hour GTT (per uptodate)  BMI >25 (>23 in Asian women) AND one of the following No: BMI 23  Exam   Vitals:   07/31/22 1123  BP: 116/66  Pulse: 69  Weight: 134 lb (60.8 kg)   Fetal Heart Rate (bpm): 177  VS reviewed, nursing note reviewed,  Constitutional: well developed, well nourished, no distress HEENT: normocephalic CV: normal rate Pulm/chest wall: normal effort Abdomen: soft Neuro: alert and oriented x 3 Skin: warm, dry Psych: affect normal    Assessment:   Pregnancy: [id number] Patient Active Problem List   Diagnosis Date Noted   Supervision of other normal pregnancy, antepartum 07/17/2022   History of poor fetal growth 08/24/2019   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 Encounter for supervision of other normal pregnancy in first trimester --Anticipatory guidance about next visits/weeks of pregnancy given.  --OB labs done at intake visit --Pt wants exemption letter for COVID vaccine for her green card application.  I discussed that the COVID vaccine is recommended in pregnancy but provided a letter verifying that she is our patient and is pregnant, and that I support her decision to defer vaccination after appropriate counseling was provided.    - Panorama Prenatal Test Full Panel  2. Genetic carrier status --Testing from previous pregnancy with + Medium Chain Acyl-CoA Dehydrogenase Deficiency '[x]'$ GC --FOB negative  3. [redacted] weeks gestation of pregnancy    Initial labs reviewed. Continue prenatal vitamins. Discussed and offered genetic screening options, including Quad screen/AFP, NIPS testing, and option to decline testing. Benefits/risks/alternatives reviewed. Pt aware that anatomy US is form of genetic screening with lower accuracy in detecting trisomies than blood work.  Pt chooses genetic screening today. NIPS: ordered. Ultrasound discussed; fetal anatomic survey: ordered. Problem list reviewed and updated. The nature of [facility] with multiple MDs and other Advanced Practice Providers was explained to patient; also emphasized that residents, students are part of our team. Routine obstetric precautions reviewed. Return in about 4 weeks (around 08/28/2022) for LOB, Any provider.   Fatima Blank, CNM 07/31/22 12:10 PM

## 2022-08-05 LAB — PANORAMA PRENATAL TEST FULL PANEL:PANORAMA TEST PLUS 5 ADDITIONAL MICRODELETIONS: FETAL FRACTION: 7.9

## 2022-08-23 ENCOUNTER — Encounter (HOSPITAL_BASED_OUTPATIENT_CLINIC_OR_DEPARTMENT_OTHER): Payer: Self-pay | Admitting: Emergency Medicine

## 2022-08-23 ENCOUNTER — Emergency Department (HOSPITAL_BASED_OUTPATIENT_CLINIC_OR_DEPARTMENT_OTHER): Payer: Medicaid Other

## 2022-08-23 ENCOUNTER — Other Ambulatory Visit: Payer: Self-pay

## 2022-08-23 ENCOUNTER — Telehealth (HOSPITAL_BASED_OUTPATIENT_CLINIC_OR_DEPARTMENT_OTHER): Payer: Self-pay | Admitting: Obstetrics & Gynecology

## 2022-08-23 ENCOUNTER — Emergency Department (HOSPITAL_BASED_OUTPATIENT_CLINIC_OR_DEPARTMENT_OTHER)
Admission: EM | Admit: 2022-08-23 | Discharge: 2022-08-23 | Disposition: A | Discharge status: Home / Self Care | Payer: Medicaid Other | Attending: Emergency Medicine | Admitting: Emergency Medicine

## 2022-08-23 ENCOUNTER — Inpatient Hospital Stay (HOSPITAL_COMMUNITY): Payer: Medicaid Other

## 2022-08-23 ENCOUNTER — Encounter (HOSPITAL_COMMUNITY): Payer: Self-pay | Admitting: Obstetrics & Gynecology

## 2022-08-23 ENCOUNTER — Inpatient Hospital Stay (EMERGENCY_DEPARTMENT_HOSPITAL)
Admission: AD | Admit: 2022-08-23 | Discharge: 2022-08-23 | Disposition: A | Discharge status: Home / Self Care | Payer: Medicaid Other | Source: Home / Self Care | Attending: Obstetrics & Gynecology | Admitting: Obstetrics & Gynecology

## 2022-08-23 DIAGNOSIS — O039 Complete or unspecified spontaneous abortion without complication: Secondary | ICD-10-CM | POA: Insufficient documentation

## 2022-08-23 DIAGNOSIS — Z3A10 10 weeks gestation of pregnancy: Secondary | ICD-10-CM | POA: Insufficient documentation

## 2022-08-23 DIAGNOSIS — O209 Hemorrhage in early pregnancy, unspecified: Secondary | ICD-10-CM | POA: Insufficient documentation

## 2022-08-23 DIAGNOSIS — O4690 Antepartum hemorrhage, unspecified, unspecified trimester: Secondary | ICD-10-CM

## 2022-08-23 DIAGNOSIS — O200 Threatened abortion: Secondary | ICD-10-CM | POA: Insufficient documentation

## 2022-08-23 LAB — URINALYSIS, ROUTINE W REFLEX MICROSCOPIC
Bilirubin Urine: NEGATIVE
Glucose, UA: NEGATIVE mg/dL
Hgb urine dipstick: NEGATIVE
Ketones, ur: 15 mg/dL — AB
Leukocytes,Ua: NEGATIVE
Nitrite: NEGATIVE
Protein, ur: NEGATIVE mg/dL
Specific Gravity, Urine: 1.027 (ref 1.005–1.030)
pH: 6.5 (ref 5.0–8.0)

## 2022-08-23 LAB — CBC
HCT: 37.9 % (ref 36.0–46.0)
Hemoglobin: 12.5 g/dL (ref 12.0–15.0)
MCH: 26.2 pg (ref 26.0–34.0)
MCHC: 33 g/dL (ref 30.0–36.0)
MCV: 79.5 fL — ABNORMAL LOW (ref 80.0–100.0)
Platelets: 219 10*3/uL (ref 150–400)
RBC: 4.77 MIL/uL (ref 3.87–5.11)
RDW: 14.9 % (ref 11.5–15.5)
WBC: 5.8 10*3/uL (ref 4.0–10.5)
nRBC: 0 % (ref 0.0–0.2)

## 2022-08-23 LAB — CBC WITH DIFFERENTIAL/PLATELET
Abs Immature Granulocytes: 0.02 10*3/uL (ref 0.00–0.07)
Basophils Absolute: 0 10*3/uL (ref 0.0–0.1)
Basophils Relative: 0 %
Eosinophils Absolute: 0.1 10*3/uL (ref 0.0–0.5)
Eosinophils Relative: 1 %
HCT: 27.8 % — ABNORMAL LOW (ref 36.0–46.0)
Hemoglobin: 9.2 g/dL — ABNORMAL LOW (ref 12.0–15.0)
Immature Granulocytes: 0 %
Lymphocytes Relative: 30 %
Lymphs Abs: 1.9 10*3/uL (ref 0.7–4.0)
MCH: 25.9 pg — ABNORMAL LOW (ref 26.0–34.0)
MCHC: 33.1 g/dL (ref 30.0–36.0)
MCV: 78.3 fL — ABNORMAL LOW (ref 80.0–100.0)
Monocytes Absolute: 0.5 10*3/uL (ref 0.1–1.0)
Monocytes Relative: 9 %
Neutro Abs: 3.8 10*3/uL (ref 1.7–7.7)
Neutrophils Relative %: 60 %
Platelets: 186 10*3/uL (ref 150–400)
RBC: 3.55 MIL/uL — ABNORMAL LOW (ref 3.87–5.11)
RDW: 14.6 % (ref 11.5–15.5)
WBC: 6.3 10*3/uL (ref 4.0–10.5)
nRBC: 0 % (ref 0.0–0.2)

## 2022-08-23 LAB — WET PREP, GENITAL
Clue Cells Wet Prep HPF POC: NONE SEEN
Sperm: NONE SEEN
Trich, Wet Prep: NONE SEEN
WBC, Wet Prep HPF POC: <10 (ref <10)
Yeast Wet Prep HPF POC: NONE SEEN

## 2022-08-23 LAB — BASIC METABOLIC PANEL
Anion gap: 14 (ref 5–15)
BUN: 7 mg/dL (ref 6–20)
CO2: 22 mmol/L (ref 22–32)
Calcium: 9.6 mg/dL (ref 8.9–10.3)
Chloride: 102 mmol/L (ref 98–111)
Creatinine, Ser: 0.47 mg/dL (ref 0.44–1.00)
GFR, Estimated: >60 mL/min (ref >60)
Glucose, Bld: 93 mg/dL (ref 70–99)
Potassium: 3.7 mmol/L (ref 3.5–5.1)
Sodium: 138 mmol/L (ref 135–145)

## 2022-08-23 LAB — HCG, QUANTITATIVE, PREGNANCY: hCG, Beta Chain, Quant, S: 18312 m[IU]/mL — ABNORMAL HIGH (ref <5)

## 2022-08-23 MED ORDER — LACTATED RINGERS IV BOLUS
1000.0000 mL | Freq: Once | INTRAVENOUS | Status: AC
  Administered 2022-08-23: 1000 mL via INTRAVENOUS

## 2022-08-23 MED ORDER — IBUPROFEN 800 MG PO TABS: 800.0000 mg | ORAL_TABLET | Freq: Three times a day (TID) | ORAL | 0 refills | Status: DC | PRN

## 2022-08-23 MED ORDER — FENTANYL CITRATE (PF) 100 MCG/2ML IJ SOLN
100.0000 ug | Freq: Once | INTRAMUSCULAR | Status: AC
  Administered 2022-08-23: 100 ug via INTRAVENOUS
  Filled 2022-08-23: qty 2

## 2022-08-23 MED ORDER — OXYCODONE HCL 5 MG PO TABS: 5.0000 mg | ORAL_TABLET | Freq: Four times a day (QID) | ORAL | 0 refills | Status: AC | PRN

## 2022-08-23 MED ORDER — MISOPROSTOL 200 MCG PO TABS
800.0000 ug | ORAL_TABLET | Freq: Once | ORAL | Status: AC
  Administered 2022-08-23: 800 ug via BUCCAL
  Filled 2022-08-23: qty 4

## 2022-08-23 NOTE — ED Triage Notes (Addendum)
Pt arrives to ED with c/o vaginal bleeding. The vaginal bleeding started yesterday. She notes that she is [redacted] weeks pregnant and is experiencing cramping.

## 2022-08-23 NOTE — ED Provider Notes (Signed)
[facility] EMERGENCY DEPT Provider Note   CSN: [id number] Arrival date & time: 08/23/22  0847    History  Chief Complaint  Patient presents with   Vaginal Bleeding    Mary Hays is a 31 y.o. female G5 P3 at 13 weeks 2 days by dates here for evaluation of prepubic cramping as well as bleeding.  Yesterday noted some dark brown blood.  No bright red blood, clots.  No recent sexual activity.  No concern for STDs.  No back pain.  No fever.  No dysuria hematuria.  No complications in pregnancy thus far.  Follows with [facility]   O positive  HPI     Home Medications Prior to Admission medications   Medication Sig Start Date End Date Taking? Authorizing Provider  acetaminophen (TYLENOL) 325 MG tablet Take 2 tablets (650 mg total) by mouth every 6 (six) hours as needed (for pain scale < 4). 10/16/19   Fair, Marin Shutter, MD  Ascorbic Acid (VITAMIN C GUMMIE PO) Take 2 each by mouth daily.    [provider]  Prenatal Vit-Fe Fumarate-FA (PRENATAL MULTIVITAMIN) TABS tablet Take 1 tablet by mouth daily.     [provider]      Allergies    Patient has no known allergies.    Review of Systems   Review of Systems  Constitutional: Negative.   HENT: Negative.    Respiratory: Negative.    Cardiovascular: Negative.   Gastrointestinal:  Positive for abdominal pain (suprapubic). Negative for abdominal distention, anal bleeding, blood in stool, constipation, diarrhea, nausea, rectal pain and vomiting.  Genitourinary:  Positive for vaginal bleeding. Negative for decreased urine volume, difficulty urinating, dyspareunia, dysuria, enuresis, flank pain, frequency, genital sores, hematuria, menstrual problem, pelvic pain, urgency, vaginal discharge and vaginal pain.  Musculoskeletal: Negative.   Skin: Negative.   Neurological: Negative.   All other systems reviewed and are negative.   Physical Exam Updated Vital Signs BP (!) 114/96 (BP Location: Right  Arm)   Pulse (!) 110   Temp 97.9 F (36.6 C) (Oral)   Resp 18   Ht '5\' 1"'$  (1.549 m)   Wt 59.9 kg   LMP 05/22/2022   SpO2 100%   BMI 24.94 kg/m  Physical Exam Vitals and nursing note reviewed. Exam conducted with a chaperone present.  Constitutional:      General: She is not in acute distress.    Appearance: She is well-developed. She is not ill-appearing, toxic-appearing or diaphoretic.  HENT:     Head: Atraumatic.  Eyes:     Pupils: Pupils are equal, round, and reactive to light.  Cardiovascular:     Rate and Rhythm: Normal rate.     Pulses: Normal pulses.     Heart sounds: Normal heart sounds.  Pulmonary:     Effort: Pulmonary effort is normal. No respiratory distress.     Breath sounds: Normal breath sounds.  Abdominal:     General: Bowel sounds are normal. There is no distension.     Palpations: Abdomen is soft.     Tenderness: There is no abdominal tenderness. There is no right CVA tenderness, left CVA tenderness, guarding or rebound.  Genitourinary:    Comments: Normal appearing external female genitalia without rashes or lesions, normal vaginal epithelium. Normal appearing cervix without discharge or petechiae. Cervical os is closed. There is scant dark bleeding noted at the os. No Odor. Bimanual: No CMT, 30 week uterus, nontender.  No palpable adnexal masses or tenderness. Uterus  midline and not fixed. Rectovaginal exam was deferred.  No cystocele or rectocele noted. No pelvic lymphadenopathy noted. Wet prep was obtained.  Cultures for gonorrhea and chlamydia collected. Exam performed with chaperone in room.   Musculoskeletal:        General: Normal range of motion.     Cervical back: Normal range of motion.  Skin:    General: Skin is warm and dry.     Capillary Refill: Capillary refill takes less than 2 seconds.  Neurological:     General: No focal deficit present.     Mental Status: She is alert.  Psychiatric:        Mood and Affect: Mood normal.    ED Results  / Procedures / Treatments   Labs (all labs ordered are listed, but only abnormal results are displayed) Labs Reviewed  CBC - Abnormal; Notable for the following components:      Result Value   MCV 79.5 (*)    All other components within normal limits  HCG, QUANTITATIVE, PREGNANCY - Abnormal; Notable for the following components:   hCG, Beta Chain, Quant, S 18,312 (*)    All other components within normal limits  URINALYSIS, ROUTINE W REFLEX MICROSCOPIC - Abnormal; Notable for the following components:   Ketones, ur 15 (*)    All other components within normal limits  WET PREP, GENITAL  BASIC METABOLIC PANEL  GC/CHLAMYDIA PROBE AMP ([place]) NOT AT [facility]    EKG None  Radiology US OB Comp < 14 Wks  Result Date: 08/23/2022 CLINICAL DATA:  Vaginal bleeding for the past 3 days. Estimated gestational age of [redacted] weeks, 5 days by first ultrasound. EXAM: OBSTETRIC <14 WK ULTRASOUND TECHNIQUE: Transabdominal ultrasound was performed for evaluation of the gestation as well as the maternal uterus and adnexal regions. COMPARISON:  Ob ultrasound dated July 18, 2022. FINDINGS: Intrauterine gestational sac: Single. Yolk sac:  Not Visualized. Embryo:  Visualized. Cardiac Activity: Not Visualized. CRL:   39 mm   10 w 6 d Subchorionic hemorrhage:  None visualized. Maternal uterus/adnexae: Unremarkable. IMPRESSION: 1. Findings meet definitive criteria for failed pregnancy. This follows SRU consensus guidelines: Diagnostic Criteria for Nonviable Pregnancy Early in the First Trimester. Alison Stalling J Med [phone]. Electronically Signed   By: Titus Dubin M.D.   On: 08/23/2022 11:33    Procedures Procedures    Medications Ordered in ED Medications - No data to display  ED Course/ Medical Decision Making/ A&P    31 year old G5, P3 currently 13 weeks 2 days pregnant here for evaluation of suprapubic cramping and dark red vaginal bleeding which began yesterday.  No large clotting.  No bright  red blood.  Otherwise normal pregnancy without complications thus far.  No dysuria or hematuria.  No fever.  No recent sexual activity or concern for STD.  Of note I reviewed her ultrasound from 1 month ago.  Did have subchorionic hemorrhage on prior OB US  O positive>> Does not need rhogam  Labs and imaging personally viewed and interpreted:  CBC without leukocytosis, hemoglobin 12.5 BMP without significant abnormality Preg positive UA neg for infection Wet prep neg US shows criteria for failed pregnancy, no heartbeat, fetus stopped growing at [redacted]w[redacted]d  Patient reassessed.  Discussed her labs and imaging.  Unfortunately she also had father that passed away while she was here in the emergency department today so she is understandably upset.  We discussed her ultrasound results here. Discussed anticipatory guidance of miscarriage.  She would like to FU  with OB/GYN for definitive management. Will have her return for new or worsening symptoms.  We discussed strict return precautions with follow-up at MAU if she develops heavy bleeding, intractable pain.  The patient has been appropriately medically screened and/or stabilized in the ED. I have low suspicion for any other emergent medical condition which would require further screening, evaluation or treatment in the ED or require inpatient management.  Patient is hemodynamically stable and in no acute distress.  Patient able to ambulate in department prior to ED.  Evaluation does not show acute pathology that would require ongoing or additional emergent interventions while in the emergency department or further inpatient treatment.  I have discussed the diagnosis with the patient and answered all questions.  Pain is been managed while in the emergency department and patient has no further complaints prior to discharge.  Patient is comfortable with plan discussed in room and is stable for discharge at this time.  I have discussed strict return precautions  for returning to the emergency department.  Patient was encouraged to follow-up with PCP/specialist refer to at discharge.                           Medical Decision Making Amount and/or Complexity of Data Reviewed External Data Reviewed: labs, radiology and notes. Labs: ordered. Decision-making details documented in ED Course. Radiology: ordered and independent interpretation performed. Decision-making details documented in ED Course.  Risk OTC drugs. Prescription drug management. Decision regarding hospitalization. Diagnosis or treatment significantly limited by social determinants of health.         Final Clinical Impression(s) / ED Diagnoses Final diagnoses:  Vaginal bleeding in pregnancy  Miscarriage    Rx / DC Orders ED Discharge Orders     None         Oberon Hehir A, PA-C 08/23/22 1211    Malvin Johns, MD 08/23/22 1451

## 2022-08-23 NOTE — ED Notes (Signed)
Pt aware of the need for a urine... Unable to currently urinate.

## 2022-08-23 NOTE — MAU Provider Note (Signed)
History     CSN: [id number]  Arrival date and time: 08/23/22 2001   None     Chief Complaint  Patient presents with   Vaginal Bleeding   Mary Hays is a 31 y.o. [id number] at 65w2dwho presents today with vaginal bleeding. She was seen [facility] today and diagnosed with a MAB with 10 week size fetus. She states that about 7:30pm she started to have heavy bleeding and severe pain. She states that she passed fetal tissue, and she has it with her.   Vaginal Bleeding The patient's primary symptoms include pelvic pain and vaginal bleeding. This is a new problem. The current episode started today. The problem occurs constantly. The problem has been unchanged. The pain is severe. The problem affects both sides. She is pregnant. The vaginal discharge was bloody. The vaginal bleeding is heavier than menses. She has been passing clots. She has been passing tissue. Nothing aggravates the symptoms. She has tried nothing for the symptoms.    OB History     Gravida  5   Para  3   Term  3   Preterm  0   AB  1   Living  3      SAB  1   IAB  0   Ectopic  0   Multiple  0   Live Births  3           Past Medical History:  Diagnosis Date   Medical history non-contributory     Past Surgical History:  Procedure Laterality Date   APPENDECTOMY     31yrs old    Family History  Problem Relation Age of Onset   Diabetes Father    Intellectual disability Sister    Down syndrome Sister     Social History   Tobacco Use   Smoking status: Never   Smokeless tobacco: Never  Vaping Use   Vaping Use: Never used  Substance Use Topics   Alcohol use: No   Drug use: No    Allergies: No Known Allergies  Medications Prior to Admission  Medication Sig Dispense Refill Last Dose   acetaminophen (TYLENOL) 325 MG tablet Take 2 tablets (650 mg total) by mouth every 6 (six) hours as needed (for pain scale < 4). 30 tablet 0    Ascorbic Acid (VITAMIN C GUMMIE PO) Take 2 each by  mouth daily.      Prenatal Vit-Fe Fumarate-FA (PRENATAL MULTIVITAMIN) TABS tablet Take 1 tablet by mouth daily.        Review of Systems  Genitourinary:  Positive for pelvic pain and vaginal bleeding.  All other systems reviewed and are negative.  Physical Exam   Blood pressure (!) 84/56, pulse 99, last menstrual period 05/22/2022, SpO2 100 %, unknown if currently breastfeeding.  Physical Exam Constitutional:      Appearance: She is well-developed.  HENT:     Head: Normocephalic.  Eyes:     Pupils: Pupils are equal, round, and reactive to light.  Cardiovascular:     Rate and Rhythm: Normal rate.  Pulmonary:     Effort: Pulmonary effort is normal. No respiratory distress.  Abdominal:     Palpations: Abdomen is soft.     Tenderness: There is no abdominal tenderness.  Genitourinary:    Vagina: No bleeding. Vaginal discharge: mucusy.    Comments: External: no lesion Vagina: approx 100cc of blood and clots in the vagina  Cervix: pink, smooth, there appears to be some tissue at the  os still. Patient does not tolerate my attempts to remove this. Will give pain medication and cytotec.  Uterus: NSSC Adnexa: NT      Musculoskeletal:        General: Normal range of motion.     Cervical back: Normal range of motion and neck supple.  Skin:    General: Skin is warm and dry.  Neurological:     Mental Status: She is alert and oriented to person, place, and time.  Psychiatric:        Mood and Affect: Mood normal.        Behavior: Behavior normal.    Results for orders placed or performed during the hospital encounter of 08/23/22 (from the past 24 hour(s))  CBC with Differential/Platelet     Status: Abnormal   Collection Time: 08/23/22  8:54 PM  Result Value Ref Range   WBC 6.3 4.0 - 10.5 K/uL   RBC 3.55 (L) 3.87 - 5.11 MIL/uL   Hemoglobin 9.2 (L) 12.0 - 15.0 g/dL   HCT 27.8 (L) 36.0 - 46.0 %   MCV 78.3 (L) 80.0 - 100.0 fL   MCH 25.9 (L) 26.0 - 34.0 pg   MCHC 33.1 30.0 - 36.0  g/dL   RDW 14.6 11.5 - 15.5 %   Platelets 186 150 - 400 K/uL   nRBC 0.0 0.0 - 0.2 %   Neutrophils Relative % 60 %   Neutro Abs 3.8 1.7 - 7.7 K/uL   Lymphocytes Relative 30 %   Lymphs Abs 1.9 0.7 - 4.0 K/uL   Monocytes Relative 9 %   Monocytes Absolute 0.5 0.1 - 1.0 K/uL   Eosinophils Relative 1 %   Eosinophils Absolute 0.1 0.0 - 0.5 K/uL   Basophils Relative 0 %   Basophils Absolute 0.0 0.0 - 0.1 K/uL   Immature Granulocytes 0 %   Abs Immature Granulocytes 0.02 0.00 - 0.07 K/uL   US OB Transvaginal  Result Date: 08/23/2022 CLINICAL DATA:  Nonviable pregnancy on ultrasound earlier today, now with suspected abortion EXAM: TRANSVAGINAL OB ULTRASOUND TECHNIQUE: Transvaginal ultrasound was performed for complete evaluation of the gestation as well as the maternal uterus, adnexal regions, and pelvic cul-de-sac. COMPARISON:  08/23/2022 at 1035 hours FINDINGS: Intrauterine gestational sac: None Maternal uterus/adnexae: Endometrial complex measures 14 mm. Left ovary is within normal limits. Right ovary is not discretely visualized.  No adnexal mass is seen. Trace free fluid. IMPRESSION: No IUP is visualized. In the setting of continued heavy bleeding, this appearance is compatible with abortion in progress. Consider repeat beta HCG in 7-10 days to confirm appropriate decline. This could be supplemented by repeat sonography at that time as clinically warranted. Electronically Signed   By: Julian Hy M.D.   On: 08/23/2022 21:46   US OB Comp < 14 Wks  Result Date: 08/23/2022 CLINICAL DATA:  Vaginal bleeding for the past 3 days. Estimated gestational age of [redacted] weeks, 5 days by first ultrasound. EXAM: OBSTETRIC <14 WK ULTRASOUND TECHNIQUE: Transabdominal ultrasound was performed for evaluation of the gestation as well as the maternal uterus and adnexal regions. COMPARISON:  Ob ultrasound dated July 18, 2022. FINDINGS: Intrauterine gestational sac: Single. Yolk sac:  Not Visualized. Embryo:   Visualized. Cardiac Activity: Not Visualized. CRL:   39 mm   10 w 6 d Subchorionic hemorrhage:  None visualized. Maternal uterus/adnexae: Unremarkable. IMPRESSION: 1. Findings meet definitive criteria for failed pregnancy. This follows SRU consensus guidelines: Diagnostic Criteria for Nonviable Pregnancy Early in the First Trimester. N Engl  J Med [phone]. Electronically Signed   By: Titus Dubin M.D.   On: 08/23/2022 11:33     MAU Course  Procedures  MDM Patient has had 824mg of cytotec buccally and 1014m of fentanyl IV. She is feeling better. USKoreahows nothing in the uterus and no concern for retained tissue. Patient with drop in hgb from earlier however she is stable and asymptomatic at this time. Plan for DC home and for FU in 2 weeks in the office.   Assessment and Plan   1. SAB (spontaneous abortion)   2. [redacted] weeks gestation of pregnancy    DC home in stable condition  1st Trimester precautions  Bleeding precautions RX: ibuprofen '800mg'$  PRN #30, oxycodone 5-'10mg'$  PRN #[id number]  Return to MAU as needed FU with OB as planned   Follow-up Information     MiMegan SalonMD Follow up.   Specialty: Obstetrics and Gynecology Why: They will call you with an appointment Contact information: [id number], CNM  08/23/22  10:06 PM

## 2022-08-23 NOTE — MAU Note (Signed)
.  Mary Hays is a 31 y.o. at 31w2dhere in MAU reporting[place] started having heavy bleeding around 2040 and called EMS. Passed fetus on the way to the hospital. Rates abdominal cramping pain 8/10.    2012 HMarcille Buffy CNM at bedside.

## 2022-08-23 NOTE — ED Notes (Signed)
RN provided AVS using Teachback Method. Patient verbalizes understanding of Discharge Instructions. Opportunity for Questioning and Answers were provided by RN. Patient Discharged from ED ambulatory to Home via Self.  

## 2022-08-23 NOTE — Discharge Instructions (Signed)
Please make sure to call your OB/GYN to let them know you are seen here today.  If you start bleeding through a pad in less than an hour you need to be seen at [facility] at the Maternity admissions unit which is Entrance C

## 2022-08-24 ENCOUNTER — Encounter (HOSPITAL_BASED_OUTPATIENT_CLINIC_OR_DEPARTMENT_OTHER): Payer: Self-pay

## 2022-08-24 ENCOUNTER — Encounter (HOSPITAL_BASED_OUTPATIENT_CLINIC_OR_DEPARTMENT_OTHER): Payer: Medicaid Other | Admitting: Obstetrics & Gynecology

## 2022-08-24 LAB — GC/CHLAMYDIA PROBE AMP (~~LOC~~) NOT AT ARMC
Chlamydia: NEGATIVE
Comment: NEGATIVE
Comment: NORMAL
Neisseria Gonorrhea: NEGATIVE

## 2022-08-30 ENCOUNTER — Encounter (HOSPITAL_BASED_OUTPATIENT_CLINIC_OR_DEPARTMENT_OTHER): Payer: Medicaid Other | Admitting: Obstetrics & Gynecology

## 2022-09-04 ENCOUNTER — Ambulatory Visit (INDEPENDENT_AMBULATORY_CARE_PROVIDER_SITE_OTHER): Payer: Medicaid Other | Admitting: Obstetrics & Gynecology

## 2022-09-04 ENCOUNTER — Encounter (HOSPITAL_BASED_OUTPATIENT_CLINIC_OR_DEPARTMENT_OTHER): Payer: Self-pay | Admitting: Obstetrics & Gynecology

## 2022-09-04 VITALS — BP 123/71 | HR 91 | Ht 61.0 in | Wt 131.6 lb

## 2022-09-04 DIAGNOSIS — Z3A10 10 weeks gestation of pregnancy: Secondary | ICD-10-CM | POA: Diagnosis not present

## 2022-09-04 DIAGNOSIS — D500 Iron deficiency anemia secondary to blood loss (chronic): Secondary | ICD-10-CM

## 2022-09-04 DIAGNOSIS — O039 Complete or unspecified spontaneous abortion without complication: Secondary | ICD-10-CM

## 2022-09-04 DIAGNOSIS — N96 Recurrent pregnancy loss: Secondary | ICD-10-CM

## 2022-09-04 NOTE — Progress Notes (Unsigned)
GYNECOLOGY  VISIT  CC:   follow up after SAB  HPI: 32 y.o. [id number] Married Other or two or more races female here for follow up after having a miscarriage.  Reports her bleeding has stopped but it was very heavy when the miscarriage occurred.  Hb did decrease to 9.2.  She is taking iron and PNV at this point.  She would like one more child.  She has 3 and now has had two miscarriages.  Chromosomal studies were not done with this miscarriage so there is some information that I am not able to provide to her but we could consider blood work for recurrent miscarriage.  She would like to proceed with this.  Will plan with follow up lab work.  Pt does need pap smear.  Will plan this in about a month.  Timing for trying to pregnancy discussed.  She is not quite ready for this but thinks they will start trying again in the next year.   Past Medical History:  Diagnosis Date   Medical history non-contributory     MEDS:   Current Outpatient Medications on File Prior to Visit  Medication Sig Dispense Refill   acetaminophen (TYLENOL) 325 MG tablet Take 2 tablets (650 mg total) by mouth every 6 (six) hours as needed (for pain scale < 4). 30 tablet 0   Ascorbic Acid (VITAMIN C GUMMIE PO) Take 2 each by mouth daily.     ibuprofen (ADVIL) 800 MG tablet Take 1 tablet (800 mg total) by mouth every 8 (eight) hours as needed. 30 tablet 0   Prenatal Vit-Fe Fumarate-FA (PRENATAL MULTIVITAMIN) TABS tablet Take 1 tablet by mouth daily.      No current facility-administered medications on file prior to visit.    ALLERGIES: Patient has no known allergies.  SH:  married, non smoker  Review of Systems  Constitutional: Negative.   Genitourinary: Negative.     PHYSICAL EXAMINATION:    BP 123/71   Pulse 91   Ht '5\' 1"'$  (1.549 m)   Wt 131 lb 9.6 oz (59.7 kg)   LMP 05/22/2022   Breastfeeding No   BMI 24.87 kg/m     General appearance: alert, cooperative and appears stated age  Assessment/Plan: 1. SAB  (spontaneous abortion) - Beta hCG quant (ref lab) - will follow hcg levels to normal and pt will return for lupus anticoagulant and antiphospholipid antibody testing -pt will need pap done as well this year if possible  Hcg, hb, testing for miscarraige, pap

## 2022-09-05 LAB — BETA HCG QUANT (REF LAB): hCG Quant: 36 m[IU]/mL

## 2022-09-25 ENCOUNTER — Ambulatory Visit (INDEPENDENT_AMBULATORY_CARE_PROVIDER_SITE_OTHER): Payer: Medicaid Other | Admitting: Obstetrics & Gynecology

## 2022-09-25 ENCOUNTER — Other Ambulatory Visit (HOSPITAL_COMMUNITY)
Admission: RE | Admit: 2022-09-25 | Discharge: 2022-09-25 | Disposition: A | Discharge status: Home / Self Care | Payer: Medicaid Other | Source: Ambulatory Visit | Attending: Obstetrics & Gynecology | Admitting: Obstetrics & Gynecology

## 2022-09-25 ENCOUNTER — Encounter (HOSPITAL_BASED_OUTPATIENT_CLINIC_OR_DEPARTMENT_OTHER): Payer: Self-pay | Admitting: Obstetrics & Gynecology

## 2022-09-25 ENCOUNTER — Encounter (HOSPITAL_BASED_OUTPATIENT_CLINIC_OR_DEPARTMENT_OTHER): Payer: Medicaid Other | Admitting: Advanced Practice Midwife

## 2022-09-25 VITALS — BP 110/82 | Ht 61.0 in | Wt 132.8 lb

## 2022-09-25 DIAGNOSIS — O039 Complete or unspecified spontaneous abortion without complication: Secondary | ICD-10-CM | POA: Diagnosis not present

## 2022-09-25 DIAGNOSIS — N96 Recurrent pregnancy loss: Secondary | ICD-10-CM

## 2022-09-25 DIAGNOSIS — Z124 Encounter for screening for malignant neoplasm of cervix: Secondary | ICD-10-CM

## 2022-09-25 DIAGNOSIS — Z3A00 Weeks of gestation of pregnancy not specified: Secondary | ICD-10-CM | POA: Diagnosis not present

## 2022-09-25 NOTE — Progress Notes (Signed)
GYNECOLOGY  VISIT  CC:   follow up after miscarriage, pap needed  HPI: 32 y.o. [phone] Married Other or two or more races female here for follow up lab work and exam after have miscarriage.  Hopefully, her final HCG level will be obtained today.  We discussed additional testing for recurrent miscarriage and she desires to have this done today.  Orders placed.    She has not started a cycle yet.  Discussed with pt typical timing after miscarriage.  She does want to try again for pregnancy.  Also, she is aware pap smear is due.  Will obtain today..   Past Medical History:  Diagnosis Date   History of recurrent miscarriages     MEDS:   Current Outpatient Medications on File Prior to Visit  Medication Sig Dispense Refill   acetaminophen (TYLENOL) 325 MG tablet Take 2 tablets (650 mg total) by mouth every 6 (six) hours as needed (for pain scale < 4). 30 tablet 0   Ascorbic Acid (VITAMIN C GUMMIE PO) Take 2 each by mouth daily.     ibuprofen (ADVIL) 800 MG tablet Take 1 tablet (800 mg total) by mouth every 8 (eight) hours as needed. 30 tablet 0   Prenatal Vit-Fe Fumarate-FA (PRENATAL MULTIVITAMIN) TABS tablet Take 1 tablet by mouth daily.      No current facility-administered medications on file prior to visit.    ALLERGIES: Patient has no known allergies.  SH:  married, non smoker  Review of Systems  Constitutional: Negative.   Genitourinary: Negative.     PHYSICAL EXAMINATION:    LMP 05/22/2022     General appearance: alert, cooperative and appears stated age Abdomen: soft, non-tender; bowel sounds normal; no masses,  no organomegaly Lymph:  no inguinal LAD noted  Pelvic: External genitalia:  no lesions              Urethra:  normal appearing urethra with no masses, tenderness or lesions              Bartholins and Skenes: normal                 Vagina: normal appearing vagina with normal color and discharge, no lesions              Cervix: no lesions              Bimanual  Exam:  Uterus:  normal size, contour, position, consistency, mobility, non-tender              Adnexa: no mass, fullness, tenderness                Assessment/Plan: 1. History of recurrent miscarriages - Antiphospholipid Syndrome Comp - Lupus anticoagulant panel( [facility])  2. Miscarriage - Beta hCG quant (ref lab)  3. Cervical cancer screening - Cytology - PAP( )

## 2022-09-26 LAB — BETA HCG QUANT (REF LAB): hCG Quant: 3 m[IU]/mL

## 2022-09-26 NOTE — Telephone Encounter (Signed)
Patient already have mychart visit.

## 2022-09-29 LAB — CYTOLOGY - PAP
Comment: NEGATIVE
Diagnosis: NEGATIVE
High risk HPV: NEGATIVE

## 2022-10-03 ENCOUNTER — Ambulatory Visit: Payer: Medicaid Other

## 2022-10-03 ENCOUNTER — Other Ambulatory Visit: Payer: Medicaid Other

## 2022-10-03 LAB — ANTIPHOSPHOLIPID SYNDROME COMP
APTT: 26.3 s
Anticardiolipin Ab, IgA: <10 [APL'U]
Anticardiolipin Ab, IgG: <10 [GPL'U]
Anticardiolipin Ab, IgM: <10 [MPL'U]
Antiphosphatidylserine IgG: 1 {GPS'U}
Antiphosphatidylserine IgM: 9 {MPS'U}
Antiprothrombin Antibody, IgG: 1 G units
Beta-2 Glycoprotein I, IgA: <10 SAU
Beta-2 Glycoprotein I, IgG: <10 SGU
Beta-2 Glycoprotein I, IgM: <10 SMU
DRVVT Screen Seconds: 33 s
Hexagonal Phospholipid Neutral: 9 s
Platelet Neutralization: 0 s

## 2022-10-03 LAB — LUPUS ANTICOAGULANT PANEL
Dilute Viper Venom Time: 31.1 s (ref 0.0–47.0)
PTT Lupus Anticoagulant: 38.9 s (ref 0.0–43.5)

## 2022-10-24 ENCOUNTER — Encounter (HOSPITAL_BASED_OUTPATIENT_CLINIC_OR_DEPARTMENT_OTHER): Payer: Medicaid Other | Admitting: Obstetrics & Gynecology

## 2022-11-21 ENCOUNTER — Encounter (HOSPITAL_BASED_OUTPATIENT_CLINIC_OR_DEPARTMENT_OTHER): Payer: Medicaid Other | Admitting: Obstetrics & Gynecology

## 2022-12-19 ENCOUNTER — Encounter (HOSPITAL_BASED_OUTPATIENT_CLINIC_OR_DEPARTMENT_OTHER): Payer: Medicaid Other | Admitting: Obstetrics & Gynecology

## 2023-01-02 ENCOUNTER — Encounter (HOSPITAL_BASED_OUTPATIENT_CLINIC_OR_DEPARTMENT_OTHER): Payer: Medicaid Other | Admitting: Obstetrics & Gynecology

## 2023-01-16 ENCOUNTER — Encounter (HOSPITAL_BASED_OUTPATIENT_CLINIC_OR_DEPARTMENT_OTHER): Payer: Medicaid Other | Admitting: Obstetrics & Gynecology

## 2023-01-30 ENCOUNTER — Encounter (HOSPITAL_BASED_OUTPATIENT_CLINIC_OR_DEPARTMENT_OTHER): Payer: Medicaid Other | Admitting: Obstetrics & Gynecology

## 2023-02-06 ENCOUNTER — Encounter (HOSPITAL_BASED_OUTPATIENT_CLINIC_OR_DEPARTMENT_OTHER): Payer: Medicaid Other | Admitting: Obstetrics & Gynecology

## 2023-02-13 ENCOUNTER — Encounter (HOSPITAL_BASED_OUTPATIENT_CLINIC_OR_DEPARTMENT_OTHER): Payer: Medicaid Other | Admitting: Obstetrics & Gynecology

## 2023-02-20 ENCOUNTER — Encounter (HOSPITAL_BASED_OUTPATIENT_CLINIC_OR_DEPARTMENT_OTHER): Payer: Medicaid Other | Admitting: Obstetrics & Gynecology

## 2023-02-27 ENCOUNTER — Encounter (HOSPITAL_BASED_OUTPATIENT_CLINIC_OR_DEPARTMENT_OTHER): Payer: Medicaid Other | Admitting: Obstetrics & Gynecology

## 2023-05-31 ENCOUNTER — Encounter (HOSPITAL_COMMUNITY): Payer: Self-pay | Admitting: *Deleted

## 2023-05-31 ENCOUNTER — Inpatient Hospital Stay (HOSPITAL_COMMUNITY)
Admission: AD | Admit: 2023-05-31 | Discharge: 2023-06-01 | Disposition: A | Discharge status: Home / Self Care | Payer: Medicaid Other | Attending: Obstetrics & Gynecology | Admitting: Obstetrics & Gynecology

## 2023-05-31 DIAGNOSIS — O26891 Other specified pregnancy related conditions, first trimester: Secondary | ICD-10-CM | POA: Insufficient documentation

## 2023-05-31 DIAGNOSIS — R1032 Left lower quadrant pain: Secondary | ICD-10-CM | POA: Diagnosis present

## 2023-05-31 DIAGNOSIS — R109 Unspecified abdominal pain: Secondary | ICD-10-CM | POA: Diagnosis not present

## 2023-05-31 DIAGNOSIS — M7918 Myalgia, other site: Secondary | ICD-10-CM

## 2023-05-31 DIAGNOSIS — Z3A01 Less than 8 weeks gestation of pregnancy: Secondary | ICD-10-CM | POA: Diagnosis not present

## 2023-05-31 DIAGNOSIS — O3680X0 Pregnancy with inconclusive fetal viability, not applicable or unspecified: Secondary | ICD-10-CM | POA: Insufficient documentation

## 2023-05-31 LAB — URINALYSIS, ROUTINE W REFLEX MICROSCOPIC
Bilirubin Urine: NEGATIVE
Glucose, UA: NEGATIVE mg/dL
Hgb urine dipstick: NEGATIVE
Ketones, ur: NEGATIVE mg/dL
Leukocytes,Ua: NEGATIVE
Nitrite: NEGATIVE
Protein, ur: NEGATIVE mg/dL
Specific Gravity, Urine: 1.023 (ref 1.005–1.030)
pH: 6 (ref 5.0–8.0)

## 2023-05-31 LAB — POCT PREGNANCY, URINE: Preg Test, Ur: POSITIVE — AB

## 2023-05-31 NOTE — MAU Note (Signed)
Pt says she has lower left side pain- 7/10- started last Sat Worse tonight- even left leg hurts -  On Tuesday - positive HPT

## 2023-06-01 ENCOUNTER — Inpatient Hospital Stay (HOSPITAL_COMMUNITY): Payer: Medicaid Other

## 2023-06-01 DIAGNOSIS — Z3A01 Less than 8 weeks gestation of pregnancy: Secondary | ICD-10-CM

## 2023-06-01 DIAGNOSIS — O26891 Other specified pregnancy related conditions, first trimester: Secondary | ICD-10-CM | POA: Diagnosis not present

## 2023-06-01 DIAGNOSIS — R109 Unspecified abdominal pain: Secondary | ICD-10-CM

## 2023-06-01 DIAGNOSIS — O3680X0 Pregnancy with inconclusive fetal viability, not applicable or unspecified: Secondary | ICD-10-CM | POA: Diagnosis not present

## 2023-06-01 LAB — CBC
HCT: 35.8 % — ABNORMAL LOW (ref 36.0–46.0)
Hemoglobin: 10.8 g/dL — ABNORMAL LOW (ref 12.0–15.0)
MCH: 22 pg — ABNORMAL LOW (ref 26.0–34.0)
MCHC: 30.2 g/dL (ref 30.0–36.0)
MCV: 72.8 fL — ABNORMAL LOW (ref 80.0–100.0)
Platelets: 255 10*3/uL (ref 150–400)
RBC: 4.92 MIL/uL (ref 3.87–5.11)
RDW: 17.7 % — ABNORMAL HIGH (ref 11.5–15.5)
WBC: 13.3 10*3/uL — ABNORMAL HIGH (ref 4.0–10.5)
nRBC: 0 % (ref 0.0–0.2)

## 2023-06-01 LAB — HCG, QUANTITATIVE, PREGNANCY: hCG, Beta Chain, Quant, S: 293 m[IU]/mL — ABNORMAL HIGH (ref <5)

## 2023-06-01 LAB — WET PREP, GENITAL
Clue Cells Wet Prep HPF POC: NONE SEEN
Sperm: NONE SEEN
Trich, Wet Prep: NONE SEEN
WBC, Wet Prep HPF POC: >=10 — AB (ref <10)
Yeast Wet Prep HPF POC: NONE SEEN

## 2023-06-01 NOTE — MAU Provider Note (Signed)
Chief Complaint: Abdominal Pain   None     SUBJECTIVE HPI: Mary Hays is a 32 y.o. [id number] at [redacted]w[redacted]d by LMP who presents to maternity admissions reporting onset of LLQ pain x 1 day that radiates down her left leg.   She denies vaginal bleeding, vaginal itching/burning, urinary symptoms, h/a, dizziness, n/v, or fever/chills.     HPI  Past Medical History:  Diagnosis Date   History of recurrent miscarriages    Past Surgical History:  Procedure Laterality Date   APPENDECTOMY     32 yrs old   Social History   Socioeconomic History   Marital status: Married    Spouse name: Not on file   Number of children: Not on file   Years of education: Not on file   Highest education level: Not on file  Occupational History   Not on file  Tobacco Use   Smoking status: Never   Smokeless tobacco: Never  Vaping Use   Vaping status: Never Used  Substance and Sexual Activity   Alcohol use: No   Drug use: No   Sexual activity: Yes    Birth control/protection: None  Other Topics Concern   Not on file  Social History Narrative   Not on file   Social Determinants of Health   Financial Resource Strain: Low Risk  (07/18/2022)   Overall Financial Resource Strain (CARDIA)    Difficulty of Paying Living Expenses: Not hard at all  Food Insecurity: No Food Insecurity (07/18/2022)   Hunger Vital Sign    Worried About Running Out of Food in the Last Year: Never true    Ran Out of Food in the Last Year: Never true  Transportation Needs: No Transportation Needs (07/18/2022)   PRAPARE - Administrator, Civil Service (Medical): No    Lack of Transportation (Non-Medical): No  Physical Activity: Insufficiently Active (07/18/2022)   Exercise Vital Sign    Days of Exercise per Week: 2 days    Minutes of Exercise per Session: 30 min  Stress: No Stress Concern Present (07/18/2022)   [employer] of Occupational Health - Occupational Stress Questionnaire    Feeling of Stress : Not  at all  Social Connections: Moderately Isolated (07/18/2022)   Social Connection and Isolation Panel [NHANES]    Frequency of Communication with Friends and Family: More than three times a week    Frequency of Social Gatherings with Friends and Family: Once a week    Attends Religious Services: Never    Database administrator or Organizations: No    Attends Banker Meetings: Never    Marital Status: Married  Catering manager Violence: Not At Risk (07/18/2022)   Humiliation, Afraid, Rape, and Kick questionnaire    Fear of Current or Ex-Partner: No    Emotionally Abused: No    Physically Abused: No    Sexually Abused: No   No current facility-administered medications on file prior to encounter.   Current Outpatient Medications on File Prior to Encounter  Medication Sig Dispense Refill   acetaminophen (TYLENOL) 325 MG tablet Take 2 tablets (650 mg total) by mouth every 6 (six) hours as needed (for pain scale < 4). 30 tablet 0   Ascorbic Acid (VITAMIN C GUMMIE PO) Take 2 each by mouth daily.     ibuprofen (ADVIL) 800 MG tablet Take 1 tablet (800 mg total) by mouth every 8 (eight) hours as needed. 30 tablet 0   Prenatal Vit-Fe Fumarate-FA (PRENATAL MULTIVITAMIN) TABS tablet  Take 1 tablet by mouth daily.      No Known Allergies  ROS:  Review of Systems  Constitutional:  Negative for chills, fatigue and fever.  Eyes:  Negative for visual disturbance.  Respiratory:  Negative for shortness of breath.   Cardiovascular:  Negative for chest pain.  Gastrointestinal:  Positive for abdominal pain. Negative for nausea and vomiting.  Genitourinary:  Negative for difficulty urinating, dysuria, flank pain, pelvic pain, vaginal bleeding, vaginal discharge and vaginal pain.  Neurological:  Negative for dizziness and headaches.  Psychiatric/Behavioral: Negative.       I have reviewed patient's Past Medical Hx, Surgical Hx, Family Hx, Social Hx, medications and allergies.   Physical  Exam  Patient Vitals for the past 24 hrs:  BP Temp Temp src Pulse Resp Height Weight  05/31/23 2005 114/62 97.6 F (36.4 C) Oral 91 16 5\' 1"  (1.549 m) 62.8 kg   Constitutional: Well-developed, well-nourished female in no acute distress.  Cardiovascular: normal rate Respiratory: normal effort GI: Abd soft, non-tender. Pos BS x 4 MS: Extremities nontender, no edema, normal ROM Neurologic: Alert and oriented x 4.  GU: Neg CVAT.  PELVIC EXAM: Deferred  LAB RESULTS Results for orders placed or performed during the hospital encounter of 05/31/23 (from the past 24 hour(s))  Pregnancy, urine POC     Status: Abnormal   Collection Time: 05/31/23  7:51 PM  Result Value Ref Range   Preg Test, Ur POSITIVE (A) NEGATIVE  Urinalysis, Routine w reflex microscopic -Urine, Clean Catch     Status: None   Collection Time: 05/31/23  7:53 PM  Result Value Ref Range   Color, Urine YELLOW YELLOW   APPearance CLEAR CLEAR   Specific Gravity, Urine 1.023 1.005 - 1.030   pH 6.0 5.0 - 8.0   Glucose, UA NEGATIVE NEGATIVE mg/dL   Hgb urine dipstick NEGATIVE NEGATIVE   Bilirubin Urine NEGATIVE NEGATIVE   Ketones, ur NEGATIVE NEGATIVE mg/dL   Protein, ur NEGATIVE NEGATIVE mg/dL   Nitrite NEGATIVE NEGATIVE   Leukocytes,Ua NEGATIVE NEGATIVE  Wet prep, genital     Status: Abnormal   Collection Time: 06/01/23 12:05 AM  Result Value Ref Range   Yeast Wet Prep HPF POC NONE SEEN NONE SEEN   Trich, Wet Prep NONE SEEN NONE SEEN   Clue Cells Wet Prep HPF POC NONE SEEN NONE SEEN   WBC, Wet Prep HPF POC >=10 (A) <10   Sperm NONE SEEN   CBC     Status: Abnormal   Collection Time: 06/01/23 12:24 AM  Result Value Ref Range   WBC 13.3 (H) 4.0 - 10.5 K/uL   RBC 4.92 3.87 - 5.11 MIL/uL   Hemoglobin 10.8 (L) 12.0 - 15.0 g/dL   HCT 16.1 (L) 09.6 - 04.5 %   MCV 72.8 (L) 80.0 - 100.0 fL   MCH 22.0 (L) 26.0 - 34.0 pg   MCHC 30.2 30.0 - 36.0 g/dL   RDW 40.9 (H) 81.1 - 91.4 %   Platelets 255 150 - 400 K/uL   nRBC 0.0  0.0 - 0.2 %  hCG, quantitative, pregnancy     Status: Abnormal   Collection Time: 06/01/23 12:24 AM  Result Value Ref Range   hCG, Beta Chain, Quant, S 293 (H) <5 mIU/mL    O/Positive/-- (11/15 1617)  IMAGING US OB LESS THAN 14 WEEKS WITH OB TRANSVAGINAL  Result Date: 06/01/2023 CLINICAL DATA:  Left lower quadrant pain EXAM: OBSTETRIC <14 WK [place] AND TRANSVAGINAL OB US TECHNIQUE: Both  transabdominal and transvaginal ultrasound examinations were performed for complete evaluation of the gestation as well as the maternal uterus, adnexal regions, and pelvic cul-de-sac. Transvaginal technique was performed to assess early pregnancy. COMPARISON:  None Available. FINDINGS: Intrauterine gestational sac: None Yolk sac:  Not Visualized. Embryo:  Not Visualized. Cardiac Activity: Not Visualized. Heart Rate:   bpm MSD:   mm    w     d CRL:    mm    w    d                  [place] EDC: Subchorionic hemorrhage:  None visualized. Maternal uterus/adnexae: No adnexal mass or abnormal free fluid. Endometrium 17 mm in thickness with small amount of fluid within the endometrial canal. IMPRESSION: No intrauterine pregnancy visualized. Differential considerations would include early intrauterine pregnancy too early to visualize, spontaneous abortion, or occult ectopic pregnancy. Recommend close clinical followup and serial quantitative beta HCGs and ultrasounds. Electronically Signed   By: Charlett Nose M.D.   On: 06/01/2023 00:52    MAU Management/MDM: Orders Placed This Encounter  Procedures   Wet prep, genital   US OB LESS THAN 14 WEEKS WITH OB TRANSVAGINAL   Urinalysis, Routine w reflex microscopic -Urine, Clean Catch   CBC   hCG, quantitative, pregnancy   Pregnancy, urine POC   Discharge patient    No orders of the defined types were placed in this encounter.  Findings today could represent a normal early pregnancy, spontaneous abortion or ectopic pregnancy which can be life-threatening.  Ectopic precautions were  given to the patient with plan to go to [facility] for repeat quant hcg to evaluate pregnancy development.. Pt discharged with strict return precautions.  ASSESSMENT 1. Pregnancy of unknown anatomic location   2. Abdominal pain during pregnancy in first trimester   3. [redacted] weeks gestation of pregnancy     PLAN Discharge home Allergies as of 06/01/2023   No Known Allergies      Medication List     STOP taking these medications    ibuprofen 800 MG tablet Commonly known as: ADVIL       TAKE these medications    acetaminophen 325 MG tablet Commonly known as: Tylenol Take 2 tablets (650 mg total) by mouth every 6 (six) hours as needed (for pain scale < 4).   prenatal multivitamin Tabs tablet Take 1 tablet by mouth daily.   VITAMIN C GUMMIE PO Take 2 each by mouth daily.        Follow-up Information     [facility] Follow up.   Specialty: Obstetrics and Gynecology Why: In 48 hours for repeat labs or sooner as needed Contact information: [address] [place] [address] [phone]                Sharen Counter Certified Nurse-Midwife 06/01/2023  1:25 AM

## 2023-06-03 ENCOUNTER — Ambulatory Visit: Payer: Self-pay

## 2023-06-03 LAB — GC/CHLAMYDIA PROBE AMP (~~LOC~~) NOT AT ARMC
Chlamydia: NEGATIVE
Comment: NEGATIVE
Comment: NORMAL
Neisseria Gonorrhea: NEGATIVE

## 2023-06-26 ENCOUNTER — Ambulatory Visit (HOSPITAL_BASED_OUTPATIENT_CLINIC_OR_DEPARTMENT_OTHER): Payer: Medicaid Other

## 2023-06-26 ENCOUNTER — Other Ambulatory Visit (HOSPITAL_BASED_OUTPATIENT_CLINIC_OR_DEPARTMENT_OTHER): Payer: Self-pay | Admitting: Obstetrics & Gynecology

## 2023-06-26 DIAGNOSIS — O3680X1 Pregnancy with inconclusive fetal viability, fetus 1: Secondary | ICD-10-CM

## 2023-06-26 DIAGNOSIS — O3680X0 Pregnancy with inconclusive fetal viability, not applicable or unspecified: Secondary | ICD-10-CM

## 2023-06-26 DIAGNOSIS — Z3A01 Less than 8 weeks gestation of pregnancy: Secondary | ICD-10-CM | POA: Diagnosis not present

## 2023-07-10 ENCOUNTER — Ambulatory Visit (HOSPITAL_BASED_OUTPATIENT_CLINIC_OR_DEPARTMENT_OTHER): Payer: Medicaid Other | Admitting: Certified Nurse Midwife

## 2023-07-10 ENCOUNTER — Ambulatory Visit (HOSPITAL_BASED_OUTPATIENT_CLINIC_OR_DEPARTMENT_OTHER): Payer: Medicaid Other | Admitting: *Deleted

## 2023-07-10 ENCOUNTER — Encounter (HOSPITAL_BASED_OUTPATIENT_CLINIC_OR_DEPARTMENT_OTHER): Payer: Self-pay

## 2023-07-10 ENCOUNTER — Other Ambulatory Visit (HOSPITAL_COMMUNITY)
Admission: RE | Admit: 2023-07-10 | Discharge: 2023-07-10 | Disposition: A | Discharge status: Home / Self Care | Payer: Medicaid Other | Source: Ambulatory Visit | Attending: Certified Nurse Midwife | Admitting: Certified Nurse Midwife

## 2023-07-10 ENCOUNTER — Encounter (HOSPITAL_BASED_OUTPATIENT_CLINIC_OR_DEPARTMENT_OTHER): Payer: Self-pay | Admitting: Certified Nurse Midwife

## 2023-07-10 VITALS — BP 108/69 | HR 78 | Wt 136.8 lb

## 2023-07-10 DIAGNOSIS — Z3481 Encounter for supervision of other normal pregnancy, first trimester: Secondary | ICD-10-CM

## 2023-07-10 DIAGNOSIS — Z3A10 10 weeks gestation of pregnancy: Secondary | ICD-10-CM

## 2023-07-10 DIAGNOSIS — Z8759 Personal history of other complications of pregnancy, childbirth and the puerperium: Secondary | ICD-10-CM | POA: Insufficient documentation

## 2023-07-10 DIAGNOSIS — Z3480 Encounter for supervision of other normal pregnancy, unspecified trimester: Secondary | ICD-10-CM | POA: Insufficient documentation

## 2023-07-10 LAB — HEPATITIS C ANTIBODY: HCV Ab: NEGATIVE

## 2023-07-10 NOTE — Progress Notes (Signed)
New OB Intake  I explained I am completing New OB Intake today. We discussed EDD of 02/03/2024, by Last Menstrual Period. Pt is [id number]. I reviewed her allergies, medications and Medical/Surgical/OB history.    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 Discussed the nature of our practice with multiple providers including residents and students. Due to the size of the practice, the delivering provider may not be the same as those providing prenatal care.   Patient is not interested in water birth. Offered upcoming OB visit with CNM to discuss further.  MyChart/Babyscripts MyChart access verified. I explained pt will have some visits in office and some virtually. Babyscripts instructions given and order placed. Patient verifies receipt of registration text/e-mail. Account successfully created and app downloaded.  Blood Pressure Cuff/Weight Scale Pt has BP cuff at home. Explained after first prenatal appt pt will check weekly and document in Babyscripts. Patient does have weight scale.  Anatomy US Explained first scheduled [place] will be around 19 weeks. Anatomy US scheduled for 09/11/23 at 930.  Is patient a candidate for Babyscripts Optimization? Yes  First visit review I reviewed new OB appt with patient. Explained pt will be seen by Merrilee Jansky, CNM at first visit. Discussed Avelina Laine genetic screening with patient. Patient desires [employer] not needed.  Routine prenatal labs  needed.     Last Pap Diagnosis  Date Value Ref Range Status  09/25/2022   Final   - Negative for intraepithelial  lesion or malignancy (NILM)    Harrie Jeans, RN 07/10/2023  10:19 AM

## 2023-07-10 NOTE — Progress Notes (Signed)
INITIAL PRENATAL VISIT  Subjective:   Mary Hays is being seen today for her first obstetrical visit.  This is a planned pregnancy. This is a desired pregnancy.  She is at [redacted]w[redacted]d gestation by LMP.  Her obstetrical history is significant for  miscarriage x 1 . Relationship with FOB: spouse, living together. Patient does intend to breast feed. Pregnancy history fully reviewed.  Patient reports nausea and no bleeding.  Indications for ASA therapy (per uptodate) One of the following: Previous pregnancy with preeclampsia, especially early onset and with an adverse outcome No Multifetal gestation No Chronic hypertension No Type 1 or 2 diabetes mellitus No Chronic kidney disease No Autoimmune disease (antiphospholipid syndrome, systemic lupus erythematosus) No  Two or more of the following: Nulliparity No Obesity (body mass index >30 kg/m2) No Family history of preeclampsia in mother or sister No Age >=31 years No Sociodemographic characteristics (African American race, low socioeconomic level) No Personal risk factors (eg, previous pregnancy with low birth weight or small for gestational age infant, previous adverse pregnancy outcome [eg, stillbirth], interval >10 years between pregnancies) No  Indications for early GDM screening  First-degree relative with diabetes No BMI >30kg/m2 No Age > 31 No Previous birth of an infant weighing >=4000 g No Gestational diabetes mellitus in a previous pregnancy No Glycated hemoglobin >=5.7 percent (39 mmol/mol), impaired glucose tolerance, or impaired fasting glucose on previous testing No High-risk race/ethnicity (eg, African American, Latino, Native American, Asian American, Pacific Islander) No Previous stillbirth of unknown cause No Maternal birthweight > 9 lbs No History of cardiovascular disease No Hypertension or on therapy for hypertension No High-density lipoprotein cholesterol level <35 mg/dL (0.98 mmol/L) and/or a triglyceride  level >250 mg/dL (1.19 mmol/L) No Polycystic ovary syndrome No Physical inactivity No Other clinical condition associated with insulin resistance (eg, severe obesity, acanthosis nigricans) No Current use of glucocorticoids No   Review of Systems:   Review of Systems  Objective:    Obstetric History OB History  Gravida Para Term Preterm AB Living  6 3 3  0 2 3  SAB IAB Ectopic Multiple Live Births  2 0 0 0 3    # Outcome Date GA Lbr Len/2nd Weight Sex Type Anes PTL Lv  6 Current           5 SAB 08/23/22 [redacted]w[redacted]d         4 Term 10/15/19 [redacted]w[redacted]d 01:40 / 00:02 7 lb 0.7 oz (3.195 kg) F Vag-Spont Local  LIV  3 Term 04/14/16 [redacted]w[redacted]d 09:15 / 00:22 8 lb 6.6 oz (3.815 kg) M Vag-Spont None  LIV  2 SAB 07/01/15          1 Term 03/13/13 [redacted]w[redacted]d  6 lb 8 oz (2.948 kg) M Vag-Spont EPI  LIV    Past Medical History:  Diagnosis Date   Anemia 11/16/2016   Last Assessment & Plan: Formatting of this note might be different from the original. Previous history of anemia.  Ferritin low.  Patient would like recheck.  Is not having any symptoms concerning for anemia currently.  Previous episodes of dizziness but these have resolved.  Evaluate today with CBC and further indices.     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 If baby has disorder it can cause seizures, breathing problems, liver problems, brain damage, coma or death. Is treatable.      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      No current facility-administered medications on file prior to visit.    No Known Allergies  Social History:  reports that she has never smoked. She has never used smokeless tobacco. She reports that she does not drink alcohol and does not use drugs.  Family History  Problem Relation Age of Onset   Diabetes Father    Intellectual disability Sister    Down syndrome Sister     The following portions of the patient's history were reviewed and updated as appropriate: allergies, current medications, past family history, past medical history, past social history, past surgical history and problem list.  Review of Systems Review of Systems    Physical Exam:  BP 108/69   Pulse 78   Wt 136 lb 12.8 oz (62.1 kg)   LMP 04/29/2023   BMI 25.85 kg/m  CONSTITUTIONAL: Well-developed, well-nourished female in no acute distress.  HENT:  Normocephalic, atraumatic.  Oropharynx is clear and moist EYES: Conjunctivae normal. No scleral icterus.  NECK: Normal range of motion, supple, no masses.  Normal thyroid.  SKIN: Skin is warm and dry. No rash noted. Not diaphoretic. No erythema. No pallor. MUSCULOSKELETAL: Normal range of motion. No tenderness.  No cyanosis, clubbing, or edema.   NEUROLOGIC: Alert and oriented to person, place, and time. Normal muscle tone coordination.  PSYCHIATRIC: Normal mood and affect. Normal behavior. Normal judgment and thought content. CARDIOVASCULAR: Normal heart rate noted, regular rhythm RESPIRATORY: Clear to auscultation bilaterally. Effort and breath sounds normal, no problems with respiration noted. BREASTS: Symmetric in size. No masses, skin changes, nipple drainage, or lymphadenopathy. ABDOMEN: Soft, normal bowel sounds, no distention noted.  No tenderness, rebound or guarding.   Fetal Heart Rate (bpm): 176   Movement: Absent       Assessment:    Pregnancy: [id number]  1. Encounter for  supervision of other normal pregnancy in first trimester - ABO/Rh - Antibody screen - CBC - Hepatitis B surface antigen - HIV Antibody (routine testing w rflx) - HIV (Save tube for possible reflex) - RPR - Rubella screen - Hepatitis C antibody - Urine Culture - Cervicovaginal ancillary only( [place]) - PANORAMA PRENATAL TEST - [place] MFM OB DETAIL +14 WK; Future  2. [redacted] weeks gestation of pregnancy - Declines Flu Vaccine - Taking PNV daily   Plan:     Initial labs drawn. Prenatal vitamins. Problem list reviewed and updated. Reviewed in detail the nature of the practice with collaborative care between  Genetic screening discussed: NIPS/First trimester screen/Quad/AFP requested. Role of ultrasound in pregnancy discussed; Anatomy US: requested. Amniocentesis discussed: not indicated. Follow up in 4 weeks. Discussed clinic routines, schedule of care and testing, genetic screening options, involvement of students and residents under the direct supervision of APPs and doctors and presence of female providers. Pt verbalized understanding.   Letta Kocher, CNM 07/10/2023 1:07 PM

## 2023-07-11 LAB — ABO/RH: Rh Factor: POSITIVE

## 2023-07-11 LAB — CBC
Hematocrit: 39.3 % (ref 34.0–46.6)
Hemoglobin: 11.8 g/dL (ref 11.1–15.9)
MCH: 23.4 pg — ABNORMAL LOW (ref 26.6–33.0)
MCHC: 30 g/dL — ABNORMAL LOW (ref 31.5–35.7)
MCV: 78 fL — ABNORMAL LOW (ref 79–97)
Platelets: 269 x10E3/uL (ref 150–450)
RBC: 5.05 x10E6/uL (ref 3.77–5.28)
RDW: 18.4 % — ABNORMAL HIGH (ref 11.7–15.4)
WBC: 10.3 x10E3/uL (ref 3.4–10.8)

## 2023-07-11 LAB — RUBELLA SCREEN: Rubella Antibodies, IGG: 4.37 {index} (ref >0.99)

## 2023-07-11 LAB — CERVICOVAGINAL ANCILLARY ONLY
Chlamydia: NEGATIVE
Comment: NEGATIVE
Comment: NORMAL
Neisseria Gonorrhea: NEGATIVE

## 2023-07-11 LAB — HEPATITIS C ANTIBODY: Hep C Virus Ab: NONREACTIVE

## 2023-07-11 LAB — RPR: RPR Ser Ql: NONREACTIVE

## 2023-07-11 LAB — ANTIBODY SCREEN: Antibody Screen: NEGATIVE

## 2023-07-11 LAB — HEPATITIS B SURFACE ANTIGEN: Hepatitis B Surface Ag: NEGATIVE

## 2023-07-11 LAB — HIV ANTIBODY (ROUTINE TESTING W REFLEX): HIV Screen 4th Generation wRfx: NONREACTIVE

## 2023-07-11 MED ORDER — FERROUS SULFATE 325 (65 FE) MG PO TABS: 325.0000 mg | ORAL_TABLET | Freq: Every day | ORAL | 3 refills | Status: AC

## 2023-07-11 NOTE — Addendum Note (Signed)
Addended byMerrilee Jansky on: 07/11/2023 09:38 AM   Modules accepted: Orders

## 2023-07-12 LAB — URINE CULTURE

## 2023-07-16 LAB — PANORAMA PRENATAL TEST FULL PANEL:PANORAMA TEST PLUS 5 ADDITIONAL MICRODELETIONS: FETAL FRACTION: 8.4

## 2023-07-29 ENCOUNTER — Ambulatory Visit (HOSPITAL_BASED_OUTPATIENT_CLINIC_OR_DEPARTMENT_OTHER): Payer: Medicaid Other | Admitting: Certified Nurse Midwife

## 2023-07-29 VITALS — HR 83 | Wt 138.6 lb

## 2023-07-29 DIAGNOSIS — Z3A13 13 weeks gestation of pregnancy: Secondary | ICD-10-CM

## 2023-07-29 DIAGNOSIS — Z3480 Encounter for supervision of other normal pregnancy, unspecified trimester: Secondary | ICD-10-CM

## 2023-07-29 DIAGNOSIS — R109 Unspecified abdominal pain: Secondary | ICD-10-CM

## 2023-07-29 DIAGNOSIS — O26899 Other specified pregnancy related conditions, unspecified trimester: Secondary | ICD-10-CM

## 2023-07-30 NOTE — Progress Notes (Signed)
    PRENATAL VISIT NOTE  Subjective:  Mary Hays is a 32 y.o. [phone] at [redacted]w[redacted]d being seen today for ongoing prenatal care.  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  Patient reports no complaints.  Contractions: Not present. Vag. Bleeding: None.  Movement: Absent. Denies leaking of fluid.   The following portions of the patient's history were reviewed and updated as appropriate: allergies, current medications, past family history, past medical history, past social history, past surgical history and problem list.   Objective:   Vitals:   07/29/23 1437  Pulse: 83  Weight: 138 lb 9.6 oz (62.9 kg)    Fetal Status: Fetal Heart Rate (bpm): 150   Movement: Absent     General:  Alert, oriented and cooperative. Patient is in no acute distress.  Skin: Skin is warm and dry. No rash noted.   Cardiovascular: Normal heart rate noted  Respiratory: Normal respiratory effort, no problems with respiration noted  Abdomen: Soft, gravid, appropriate for gestational age.  Pain/Pressure: Present     Pelvic:         Extremities: Normal range of motion.  Edema: None  Mental Status: Normal mood and affect. Normal behavior. Normal judgment and thought content.   Assessment and Plan:  Pregnancy: [id number] at [redacted]w[redacted]d 1. Cramping affecting pregnancy, antepartum - Urine Culture  2. [redacted] weeks gestation of pregnancy   3. Supervision of other normal pregnancy, antepartum   Preterm labor symptoms and general obstetric precautions including but not limited to vaginal bleeding, contractions, leaking of fluid and fetal movement were reviewed in detail with the patient. Please refer to After Visit Summary  for other counseling recommendations.   No follow-ups on file.  Future Appointments  Date Time Provider Department Center  08/07/2023  8:55 AM Jekhi Bolin, Toma Aran, CNM [facility]  09/03/2023  8:55 AM Anthany Thornhill, Toma Aran, CNM [facility]  09/11/2023  9:15 AM [facility] NURSE [facility]  09/11/2023  9:30 AM [facility] US4 [facility]  10/02/2023  8:55 AM Gaege Sangalang, Toma Aran, CNM [facility]  10/30/2023  8:35 AM Jerene Bears, MD [facility]  11/27/2023  8:55 AM Gabrille Kilbride, Toma Aran, CNM [facility]  12/11/2023  8:55 AM Kamarri Fischetti, Toma Aran, CNM [facility]  12/25/2023  8:55 AM Jerene Bears, MD [facility]  01/08/2024  8:55 AM Marton Redwood, Toma Aran, CNM [facility]  01/15/2024  8:55 AM Jerene Bears, MD [facility]  01/22/2024  8:55 AM Jerene Bears, MD [facility]  01/29/2024  8:55 AM Jerene Bears, MD [facility]  02/05/2024  8:55 AM Jerene Bears, MD [facility]    Letta Kocher, CNM

## 2023-07-31 LAB — URINE CULTURE

## 2023-08-07 ENCOUNTER — Ambulatory Visit (HOSPITAL_BASED_OUTPATIENT_CLINIC_OR_DEPARTMENT_OTHER): Payer: Medicaid Other | Admitting: Certified Nurse Midwife

## 2023-08-07 VITALS — BP 108/68 | HR 83 | Wt 138.2 lb

## 2023-08-07 DIAGNOSIS — Z3480 Encounter for supervision of other normal pregnancy, unspecified trimester: Secondary | ICD-10-CM

## 2023-08-07 DIAGNOSIS — Z3A14 14 weeks gestation of pregnancy: Secondary | ICD-10-CM

## 2023-08-08 NOTE — Progress Notes (Signed)
    PRENATAL VISIT NOTE  Subjective:  Mary Hays is a 32 y.o. [phone] at [redacted]w[redacted]d being seen today for ongoing prenatal care.  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  Patient reports no bleeding, no contractions, no cramping, and no leaking.  Contractions: Not present. Vag. Bleeding: None.  Movement: Present. Denies leaking of fluid.   The following portions of the patient's history were reviewed and updated as appropriate: allergies, current medications, past family history, past medical history, past social history, past surgical history and problem list.   Objective:   Vitals:   08/07/23 1555  BP: 108/68  Pulse: 83  Weight: 138 lb 3.2 oz (62.7 kg)    Fetal Status:     Movement: Present     General:  Alert, oriented and cooperative. Patient is in no acute distress.  Skin: Skin is warm and dry. No rash noted.   Cardiovascular: Normal heart rate noted  Respiratory: Normal respiratory effort, no problems with respiration noted  Abdomen: Soft, gravid, appropriate for gestational age.  Pain/Pressure: Absent     Pelvic: Cervical exam deferred        Extremities: Normal range of motion.  Edema: None  Mental Status: Normal mood and affect. Normal behavior. Normal judgment and thought content.   Assessment and Plan:  Pregnancy: [id number] at [redacted]w[redacted]d 1. [redacted] weeks gestation of pregnancy - Taking PNV  2. Supervision of other normal pregnancy, antepartum   Preterm labor symptoms and general obstetric precautions including but not limited to vaginal bleeding, contractions, leaking of fluid and fetal movement were reviewed in detail with the patient. Please refer to After Visit  Summary for other counseling recommendations.   No follow-ups on file.  Future Appointments  Date Time Provider Department Center  09/03/2023  8:55 AM Gracen Southwell, Toma Aran, CNM [facility]  09/11/2023  9:15 AM [facility] NURSE [facility]  09/11/2023  9:30 AM [facility] US4 [facility]  10/02/2023  8:55 AM Keyauna Graefe, Toma Aran, CNM [facility]  10/30/2023  8:35 AM Jerene Bears, MD [facility]  11/27/2023  8:55 AM Khaliyah Northrop, Toma Aran, CNM [facility]  12/11/2023  8:55 AM Tomaz Janis, Toma Aran, CNM [facility]  12/25/2023  8:55 AM Jerene Bears, MD [facility]  01/08/2024  8:55 AM Marton Redwood, Toma Aran, CNM [facility]  01/15/2024  8:55 AM Jerene Bears, MD [facility]  01/22/2024  8:55 AM Jerene Bears, MD [facility]  01/29/2024  8:55 AM Jerene Bears, MD [facility]  02/05/2024  8:55 AM Jerene Bears, MD [facility]    Letta Kocher, CNM

## 2023-09-03 ENCOUNTER — Ambulatory Visit (HOSPITAL_BASED_OUTPATIENT_CLINIC_OR_DEPARTMENT_OTHER): Payer: Medicaid Other | Admitting: Certified Nurse Midwife

## 2023-09-03 VITALS — BP 109/64 | HR 77 | Wt 140.4 lb

## 2023-09-03 DIAGNOSIS — Z3A18 18 weeks gestation of pregnancy: Secondary | ICD-10-CM

## 2023-09-03 DIAGNOSIS — R898 Other abnormal findings in specimens from other organs, systems and tissues: Secondary | ICD-10-CM

## 2023-09-03 DIAGNOSIS — Z3480 Encounter for supervision of other normal pregnancy, unspecified trimester: Secondary | ICD-10-CM

## 2023-09-03 DIAGNOSIS — Z1159 Encounter for screening for other viral diseases: Secondary | ICD-10-CM

## 2023-09-03 DIAGNOSIS — B009 Herpesviral infection, unspecified: Secondary | ICD-10-CM

## 2023-09-03 NOTE — Progress Notes (Signed)
    PRENATAL VISIT NOTE  Subjective:  Mary Hays is a 32 y.o. [phone] at [redacted]w[redacted]d being seen today for ongoing prenatal care.  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  Patient reports no complaints.  Contractions: Not present. Vag. Bleeding: None.  Movement: Present. Denies leaking of fluid.   The following portions of the patient's history were reviewed and updated as appropriate: allergies, current medications, past family history, past medical history, past social history, past surgical history and problem list.   Objective:   Vitals:   09/03/23 0858  BP: 109/64  Pulse: 77  Weight: 140 lb 6.4 oz (63.7 kg)    Fetal Status: Fetal Heart Rate (bpm): 150   Movement: Present     General:  Alert, oriented and cooperative. Patient is in no acute distress.  Skin: Skin is warm and dry. No rash noted.   Cardiovascular: Normal heart rate noted  Respiratory: Normal respiratory effort, no problems with respiration noted  Abdomen: Soft, gravid, appropriate for gestational age.  Pain/Pressure: Absent     Pelvic: Cervical exam deferred        Extremities: Normal range of motion.  Edema: None  Mental Status: Normal mood and affect. Normal behavior. Normal judgment and thought content.   Assessment and Plan:  Pregnancy: [id number] at [redacted]w[redacted]d  1. [redacted] weeks gestation of pregnancy (Primary) - AFP, Serum, Open Spina Bifida  2. Supervision of other normal pregnancy, antepartum   Preterm labor symptoms and general obstetric precautions including but not limited to vaginal bleeding, contractions, leaking of fluid and fetal movement were reviewed in detail with the patient. Please refer to  After Visit Summary for other counseling recommendations.   No follow-ups on file.  Future Appointments  Date Time Provider Department Center  09/11/2023  9:15 AM [facility] NURSE [facility]  09/11/2023  9:30 AM [facility] US4 [facility]  10/02/2023  8:55 AM Eh Sauseda, Arland POUR, CNM [facility]  10/30/2023  8:35 AM Cleotilde Ronal RAMAN, MD [facility]  11/27/2023  8:55 AM Laniqua Torrens, Arland POUR, CNM [facility]  12/11/2023  8:55 AM Ova Gillentine, Arland POUR, CNM [facility]  12/25/2023  8:55 AM Cleotilde Ronal RAMAN, MD [facility]  01/08/2024  8:55 AM Corderro Koloski, Arland POUR, CNM [facility]  01/15/2024  8:55 AM Cleotilde Ronal RAMAN, MD [facility]  01/22/2024  8:55 AM Cleotilde Ronal RAMAN, MD [facility]  01/29/2024  8:55 AM Cleotilde Ronal RAMAN, MD [facility]  02/05/2024  8:55 AM Cleotilde Ronal RAMAN, MD [facility]    Arland POUR Roller, CNM

## 2023-09-04 NOTE — L&D Delivery Note (Signed)
 OB/GYN Faculty Practice Delivery Note  Mary Hays is a 33 y.o. [id number] s/p SVD at [redacted]w[redacted]d. She was admitted for IOL for A1GDM.   ROM: 3h 77m with clear fluid GBS Status:  Negative/-- (05/07 1003) Maximum Maternal Temperature: 98.65F  Labor Progress: Initial SVE: 1.5/50/ballotable. 50 mcg vaginal cytotec . AROM clear. 2 x Fentanyl . She then progressed to complete @1740 .   Delivery Date/Time: 01/28/2024 @1752   Delivery: Called to room and patient was complete and pushing. Head delivered ROA. Loose nuchal cord present, delivered through. Shoulder and body delivered in usual fashion. Infant with spontaneous cry, placed on mother's abdomen, dried and stimulated. Cord clamped x 2 after 1-minute delay, and cut by FOR Amer. Cord blood drawn. Placenta delivered spontaneously with gentle cord traction. Fundus firm with massage and Pitocin . Labia, perineum, vagina, and cervix inspected with 1st degree perineal laceration. Repaired with 3.0 Vicryl with one figure of eight suture.   Baby Weight: 3290g  Placenta: 3 vessel, intact. Sent to L&D Complications: None Lacerations: 1st degree laceration, repair EBL: 167 mL Analgesia: Epidural   Infant:  APGAR (1 MIN):  8 APGAR (5 MINS):  9  Darrow End, MD OB Family Medicine Fellow, [facility] for [facility], [facility] 01/28/2024, 6:27 PM

## 2023-09-06 LAB — AFP, SERUM, OPEN SPINA BIFIDA
AFP MoM: 0.67
AFP Value: 31.8 ng/mL
Gest. Age on Collection Date: 18.1 wk
Maternal Age At EDD: 32.3 a
OSBR Risk 1 IN: 10000
Test Results:: NEGATIVE
Weight: 132 [lb_av]

## 2023-09-11 ENCOUNTER — Ambulatory Visit: Payer: Medicaid Other | Attending: Certified Nurse Midwife

## 2023-09-11 ENCOUNTER — Encounter: Payer: Self-pay | Admitting: *Deleted

## 2023-09-11 ENCOUNTER — Other Ambulatory Visit: Payer: Self-pay

## 2023-09-11 ENCOUNTER — Ambulatory Visit: Payer: Medicaid Other | Admitting: *Deleted

## 2023-09-11 VITALS — BP 103/57 | HR 76

## 2023-09-11 DIAGNOSIS — O98512 Other viral diseases complicating pregnancy, second trimester: Secondary | ICD-10-CM | POA: Insufficient documentation

## 2023-09-11 DIAGNOSIS — Z363 Encounter for antenatal screening for malformations: Secondary | ICD-10-CM | POA: Diagnosis not present

## 2023-09-11 DIAGNOSIS — Z3689 Encounter for other specified antenatal screening: Secondary | ICD-10-CM | POA: Diagnosis present

## 2023-09-11 DIAGNOSIS — O358XX0 Maternal care for other (suspected) fetal abnormality and damage, not applicable or unspecified: Secondary | ICD-10-CM

## 2023-09-11 DIAGNOSIS — Z3481 Encounter for supervision of other normal pregnancy, first trimester: Secondary | ICD-10-CM

## 2023-09-11 DIAGNOSIS — B009 Herpesviral infection, unspecified: Secondary | ICD-10-CM | POA: Diagnosis not present

## 2023-09-11 DIAGNOSIS — O321XX0 Maternal care for breech presentation, not applicable or unspecified: Secondary | ICD-10-CM | POA: Insufficient documentation

## 2023-09-11 DIAGNOSIS — Z3A19 19 weeks gestation of pregnancy: Secondary | ICD-10-CM | POA: Diagnosis not present

## 2023-10-02 ENCOUNTER — Ambulatory Visit (HOSPITAL_BASED_OUTPATIENT_CLINIC_OR_DEPARTMENT_OTHER): Payer: Medicaid Other | Admitting: Certified Nurse Midwife

## 2023-10-02 VITALS — BP 109/58 | HR 81 | Wt 146.0 lb

## 2023-10-02 DIAGNOSIS — Z3480 Encounter for supervision of other normal pregnancy, unspecified trimester: Secondary | ICD-10-CM

## 2023-10-02 DIAGNOSIS — Z8759 Personal history of other complications of pregnancy, childbirth and the puerperium: Secondary | ICD-10-CM | POA: Diagnosis not present

## 2023-10-02 DIAGNOSIS — R898 Other abnormal findings in specimens from other organs, systems and tissues: Secondary | ICD-10-CM | POA: Diagnosis not present

## 2023-10-02 DIAGNOSIS — B009 Herpesviral infection, unspecified: Secondary | ICD-10-CM

## 2023-10-02 DIAGNOSIS — Z1159 Encounter for screening for other viral diseases: Secondary | ICD-10-CM | POA: Diagnosis not present

## 2023-10-02 DIAGNOSIS — Z3A22 22 weeks gestation of pregnancy: Secondary | ICD-10-CM

## 2023-10-02 LAB — HEMOGLOBIN A1C
Est. average glucose Bld gHb Est-mCnc: 108 mg/dL
Hgb A1c MFr Bld: 5.4 % (ref 4.8–5.6)

## 2023-10-02 NOTE — Progress Notes (Signed)
    PRENATAL VISIT NOTE  Subjective:  Mary Hays is a 33 y.o. [phone] at [redacted]w[redacted]d being seen today for ongoing prenatal care.  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  Patient reports no complaints.  Contractions: Not present. Vag. Bleeding: None.  Movement: Present. Denies leaking of fluid.   The following portions of the patient's history were reviewed and updated as appropriate: allergies, current medications, past family history, past medical history, past social history, past surgical history and problem list.   Objective:   Vitals:   10/02/23 0859  BP: (!) 109/58  Pulse: 81  Weight: 146 lb (66.2 kg)    Fetal Status: Fetal Heart Rate (bpm): 155   Movement: Present     General:  Alert, oriented and cooperative. Patient is in no acute distress.  Skin: Skin is warm and dry. No rash noted.   Cardiovascular: Normal heart rate noted  Respiratory: Normal respiratory effort, no problems with respiration noted  Abdomen: Soft, gravid, appropriate for gestational age.  Pain/Pressure: Absent     Pelvic: Cervical exam deferred        Extremities: Normal range of motion.  Edema: Trace  Mental Status: Normal mood and affect. Normal behavior. Normal judgment and thought content.   Assessment and Plan:  Pregnancy: [id number] at [redacted]w[redacted]d 1. Supervision of other normal pregnancy, antepartum (Primary) - Pt reports active fetal movement - Discussed labs and 2hr GTT at next RPV - Hemoglobin A1c - [place] (09/11/23) for anatomy: Breech, anterior placenta, AFI wnl, EFW 10oz (41%). CL 3.1cm Anatomy scan complete.  2. 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 Matenal  carrier for Medium Chain Acyl-CoA Dehydrogenase deficiency - Spouse has screened negative per pt  4. History of miscarriage  5. [redacted] weeks gestation of pregnancy   Preterm labor symptoms and general obstetric precautions including but not limited to vaginal bleeding, contractions, leaking of fluid and fetal movement were reviewed in detail with the patient. Please refer to After Visit Summary for other counseling recommendations.   No follow-ups on file.  Future Appointments  Date Time Provider Department Center  10/30/2023  8:35 AM Jerene Bears, MD [facility]  11/27/2023  8:55 AM Marton Redwood, Toma Aran, CNM [facility]  12/11/2023  8:55 AM Jakki Doughty, Toma Aran, CNM [facility]  12/25/2023  8:55 AM Jerene Bears, MD [facility]  01/08/2024  8:55 AM Marton Redwood, Toma Aran, CNM [facility]  01/15/2024  8:55 AM Jerene Bears, MD [facility]  01/22/2024  8:55 AM Jerene Bears, MD [facility]  01/29/2024  8:55 AM Jerene Bears, MD [facility]  02/05/2024  8:55 AM Jerene Bears, MD [facility]    Letta Kocher, CNM

## 2023-10-03 ENCOUNTER — Encounter (HOSPITAL_BASED_OUTPATIENT_CLINIC_OR_DEPARTMENT_OTHER): Payer: Self-pay | Admitting: Certified Nurse Midwife

## 2023-10-30 ENCOUNTER — Ambulatory Visit (INDEPENDENT_AMBULATORY_CARE_PROVIDER_SITE_OTHER): Payer: Medicaid Other | Admitting: Obstetrics & Gynecology

## 2023-10-30 VITALS — BP 113/73 | HR 91 | Wt 149.6 lb

## 2023-10-30 DIAGNOSIS — Z1159 Encounter for screening for other viral diseases: Secondary | ICD-10-CM

## 2023-10-30 DIAGNOSIS — R898 Other abnormal findings in specimens from other organs, systems and tissues: Secondary | ICD-10-CM

## 2023-10-30 DIAGNOSIS — Z8279 Family history of other congenital malformations, deformations and chromosomal abnormalities: Secondary | ICD-10-CM

## 2023-10-30 DIAGNOSIS — Z3A26 26 weeks gestation of pregnancy: Secondary | ICD-10-CM | POA: Diagnosis not present

## 2023-10-30 DIAGNOSIS — O99013 Anemia complicating pregnancy, third trimester: Secondary | ICD-10-CM | POA: Diagnosis not present

## 2023-10-30 DIAGNOSIS — Z3480 Encounter for supervision of other normal pregnancy, unspecified trimester: Secondary | ICD-10-CM

## 2023-10-30 DIAGNOSIS — Z3482 Encounter for supervision of other normal pregnancy, second trimester: Secondary | ICD-10-CM

## 2023-10-30 DIAGNOSIS — B009 Herpesviral infection, unspecified: Secondary | ICD-10-CM

## 2023-10-30 NOTE — Progress Notes (Signed)
   PRENATAL VISIT NOTE  Subjective:  Mary Hays is a 33 y.o. [phone] at [redacted]w[redacted]d being seen today for ongoing prenatal care.  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  Patient reports no complaints.  Contractions: Not present. Vag. Bleeding: None.  Movement: Present. Denies leaking of fluid.   The following portions of the patient's history were reviewed and updated as appropriate: allergies, current medications, past family history, past medical history, past social history, past surgical history and problem list.   Objective:   Vitals:   10/30/23 0837  BP: 113/73  Pulse: 91  Weight: 149 lb 9.6 oz (67.9 kg)    Fetal Status: Fetal Heart Rate (bpm): 145   Movement: Present     General:  Alert, oriented and cooperative. Patient is in no acute distress.  Skin: Skin is warm and dry. No rash noted.   Cardiovascular: Normal heart rate noted  Respiratory: Normal respiratory effort, no problems with respiration noted  Abdomen: Soft, gravid, appropriate for gestational age.  Pain/Pressure: Absent     Pelvic: Cervical exam deferred        Extremities: Normal range of motion.  Edema: Trace  Mental Status: Normal mood and affect. Normal behavior. Normal judgment and thought content.   Assessment and Plan:  Pregnancy: [id number] at [redacted]w[redacted]d 1. Encounter for supervision of other normal pregnancy in second trimester (Primary) - on PNV and iron - CBC - Glucose Tolerance, 2 Hours w/1 Hour - HIV Antibody (routine testing w rflx) - RPR  2. [redacted] weeks gestation of pregnancy  3. 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   Discussed today.  She is ok with valtrex at 36 weeks. 4. Family history of Down syndrome - sister who was born when pt's mother was age 51  5. 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 Anemia during pregnancy in third trimester - on oral iron three times weekly   Preterm labor symptoms and general obstetric precautions including but not limited to vaginal bleeding, contractions, leaking of fluid and fetal movement were reviewed in detail with the patient. Please refer to After Visit Summary for other counseling recommendations.   Return in about 4 weeks (around 11/27/2023).  Future Appointments  Date Time Provider Department Center  11/27/2023  8:55 AM Lo, Toma Aran, CNM [facility]  12/11/2023  8:55 AM Lo, Toma Aran, CNM [facility]  12/25/2023  8:55 AM Jerene Bears, MD [facility]  01/08/2024  8:55 AM Marton Redwood, Toma Aran, CNM [facility]  01/15/2024  8:55 AM Jerene Bears, MD [facility]  01/22/2024  8:55 AM Marton Redwood, Toma Aran, CNM [facility]  01/29/2024  8:55 AM Jerene Bears, MD [facility]  02/05/2024  8:55 AM Jerene Bears, MD [facility]    Jerene Bears, MD

## 2023-10-31 LAB — HIV ANTIBODY (ROUTINE TESTING W REFLEX): HIV Screen 4th Generation wRfx: NONREACTIVE

## 2023-10-31 LAB — CBC
Hematocrit: 35.1 % (ref 34.0–46.6)
Hemoglobin: 10.7 g/dL — ABNORMAL LOW (ref 11.1–15.9)
MCH: 24.3 pg — ABNORMAL LOW (ref 26.6–33.0)
MCHC: 30.5 g/dL — ABNORMAL LOW (ref 31.5–35.7)
MCV: 80 fL (ref 79–97)
Platelets: 226 x10E3/uL (ref 150–450)
RBC: 4.41 x10E6/uL (ref 3.77–5.28)
RDW: 14.4 % (ref 11.7–15.4)
WBC: 13.2 x10E3/uL — ABNORMAL HIGH (ref 3.4–10.8)

## 2023-10-31 LAB — RPR: RPR Ser Ql: NONREACTIVE

## 2023-10-31 LAB — GLUCOSE TOLERANCE, 2 HOURS W/ 1HR
Glucose, 1 hour: 194 mg/dL — ABNORMAL HIGH (ref 70–179)
Glucose, 2 hour: 153 mg/dL — ABNORMAL HIGH (ref 70–152)
Glucose, Fasting: 89 mg/dL (ref 70–91)

## 2023-11-04 ENCOUNTER — Encounter (HOSPITAL_BASED_OUTPATIENT_CLINIC_OR_DEPARTMENT_OTHER): Payer: Self-pay | Admitting: Obstetrics & Gynecology

## 2023-11-04 ENCOUNTER — Other Ambulatory Visit (HOSPITAL_BASED_OUTPATIENT_CLINIC_OR_DEPARTMENT_OTHER): Payer: Self-pay | Admitting: *Deleted

## 2023-11-04 DIAGNOSIS — O24410 Gestational diabetes mellitus in pregnancy, diet controlled: Secondary | ICD-10-CM

## 2023-11-04 DIAGNOSIS — O24419 Gestational diabetes mellitus in pregnancy, unspecified control: Secondary | ICD-10-CM | POA: Insufficient documentation

## 2023-11-04 MED ORDER — ACCU-CHEK SOFTCLIX LANCETS MISC: 6 refills | Status: AC

## 2023-11-04 MED ORDER — ACCU-CHEK GUIDE TEST VI STRP: ORAL_STRIP | 6 refills | Status: DC

## 2023-11-04 MED ORDER — ACCU-CHEK GUIDE W/DEVICE KIT: 1.0000 | PACK | Freq: Once | 0 refills | Status: AC

## 2023-11-04 NOTE — Progress Notes (Signed)
 Rx sent to pharmacy for glucometer and test strips.

## 2023-11-13 ENCOUNTER — Ambulatory Visit

## 2023-11-27 ENCOUNTER — Ambulatory Visit (HOSPITAL_BASED_OUTPATIENT_CLINIC_OR_DEPARTMENT_OTHER): Payer: Medicaid Other | Admitting: Certified Nurse Midwife

## 2023-11-27 VITALS — BP 99/61 | HR 76 | Wt 151.4 lb

## 2023-11-27 DIAGNOSIS — Z3483 Encounter for supervision of other normal pregnancy, third trimester: Secondary | ICD-10-CM | POA: Diagnosis not present

## 2023-11-27 DIAGNOSIS — R898 Other abnormal findings in specimens from other organs, systems and tissues: Secondary | ICD-10-CM | POA: Diagnosis not present

## 2023-11-27 DIAGNOSIS — Z3A30 30 weeks gestation of pregnancy: Secondary | ICD-10-CM | POA: Diagnosis not present

## 2023-11-27 DIAGNOSIS — Z1159 Encounter for screening for other viral diseases: Secondary | ICD-10-CM | POA: Diagnosis not present

## 2023-11-27 DIAGNOSIS — B009 Herpesviral infection, unspecified: Secondary | ICD-10-CM

## 2023-11-27 DIAGNOSIS — Z3480 Encounter for supervision of other normal pregnancy, unspecified trimester: Secondary | ICD-10-CM

## 2023-12-02 NOTE — Progress Notes (Signed)
   PRENATAL VISIT NOTE  Subjective:  Mary Hays is a 33 y.o. [phone] at 101w0d being seen today for ongoing prenatal care.  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  Patient reports no bleeding, no contractions, no cramping, and no leaking.  Contractions: Not present. Vag. Bleeding: None.  Movement: Present. Denies leaking of fluid.   The following portions of the patient's history were reviewed and updated as appropriate: allergies, current medications, past family history, past medical history, past social history, past surgical history and problem list.   Objective:   Vitals:   11/27/23 0853  BP: 99/61  Pulse: 76  Weight: 151 lb 6.4 oz (68.7 kg)    Fetal Status:     Movement: Present     General:  Alert, oriented and cooperative. Patient is in no acute distress.  Skin: Skin is warm and dry. No rash noted.   Cardiovascular: Normal heart rate noted  Respiratory: Normal respiratory effort, no problems with respiration noted  Abdomen: Soft, gravid, appropriate for gestational age.  Pain/Pressure: Absent     Pelvic: Cervical exam deferred        Extremities: Normal range of motion.  Edema: Trace (hands)  Mental Status: Normal mood and affect. Normal behavior. Normal judgment and thought content.   Assessment and Plan:  Pregnancy: [id number] at [redacted]w[redacted]d 1. Supervision of other normal pregnancy, antepartum (Primary) - continue PNV and Iron supplementation  2. 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 [redacted] weeks gestation of pregnancy   Preterm labor  symptoms and general obstetric precautions including but not limited to vaginal bleeding, contractions, leaking of fluid and fetal movement were reviewed in detail with the patient. Please refer to After Visit Summary for other counseling recommendations.   No follow-ups on file.  Future Appointments  Date Time Provider Department Center  12/11/2023  8:55 AM Shanice Poznanski, Toma Aran, CNM [facility]  12/25/2023  8:55 AM Jerene Bears, MD [facility]  01/08/2024  8:55 AM Marton Redwood, Toma Aran, CNM [facility]  01/15/2024  8:55 AM Jerene Bears, MD [facility]  01/22/2024  8:55 AM Marton Redwood, Toma Aran, CNM [facility]  01/29/2024  8:55 AM Jerene Bears, MD [facility]  02/05/2024  8:55 AM Jerene Bears, MD [facility]    Letta Kocher, CNM

## 2023-12-11 ENCOUNTER — Ambulatory Visit (HOSPITAL_BASED_OUTPATIENT_CLINIC_OR_DEPARTMENT_OTHER): Payer: Medicaid Other | Admitting: Certified Nurse Midwife

## 2023-12-11 VITALS — BP 110/55 | HR 95 | Wt 153.0 lb

## 2023-12-11 DIAGNOSIS — B009 Herpesviral infection, unspecified: Secondary | ICD-10-CM

## 2023-12-11 DIAGNOSIS — O2643 Herpes gestationis, third trimester: Secondary | ICD-10-CM

## 2023-12-11 DIAGNOSIS — Z3A32 32 weeks gestation of pregnancy: Secondary | ICD-10-CM | POA: Diagnosis not present

## 2023-12-11 NOTE — Progress Notes (Signed)
   PRENATAL VISIT NOTE  Her children are on Spring Break next week. They will fly to [place] with her sister and family. Spouse will take children to [facility] but pt will stay in [place].  Subjective:  Mary Hays is a 33 y.o. [phone] at [redacted]w[redacted]d being seen today for ongoing prenatal care.  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  Patient reports no bleeding, no contractions, no cramping, and no leaking.  Contractions: Not present. Vag. Bleeding: None.  Movement: Present. Denies leaking of fluid.   The following portions of the patient's history were reviewed and updated as appropriate: allergies, current medications, past family history, past medical history, past social history, past surgical history and problem list.   Objective:   Vitals:   12/11/23 0852  BP: (!) 110/55  Pulse: 95  Weight: 153 lb (69.4 kg)    Fetal Status: Fetal Heart Rate (bpm): 140 Fundal Height: 32 cm Movement: Present     General:  Alert, oriented and cooperative. Patient is in no acute distress.  Skin: Skin is warm and dry. No rash noted.   Cardiovascular: Normal heart rate noted  Respiratory: Normal respiratory effort, no problems with respiration noted  Abdomen: Soft, gravid, appropriate for gestational age.  Pain/Pressure: Present     Pelvic: Cervical exam deferred        Extremities: Normal range of motion.  Edema: Trace (hands)  Mental Status: Normal mood and affect. Normal behavior. Normal judgment and thought content.   Assessment and Plan:  Pregnancy: [id number] at [redacted]w[redacted]d  1. Supervision of other normal pregnancy, antepartum (Primary) - continue PNV and Iron  supplementation   2. 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 Preterm labor symptoms and general obstetric precautions including but not limited to vaginal bleeding, contractions, leaking of fluid and fetal movement were reviewed in detail with the patient. Please refer to After Visit Summary for other counseling recommendations.   No follow-ups on file.  Future Appointments  Date Time Provider Department Center  12/25/2023  8:55 AM Jerene Bears, MD [facility]  01/08/2024  8:55 AM Romone Shaff, Toma Aran, CNM [facility]  01/15/2024  8:55 AM Jerene Bears, MD [facility]  01/22/2024  8:55 AM Marton Redwood, Toma Aran, CNM [facility]  01/29/2024  8:55 AM Jerene Bears, MD [facility]  02/05/2024  8:55 AM Jerene Bears, MD [facility]    Letta Kocher, CNM

## 2023-12-25 ENCOUNTER — Ambulatory Visit (HOSPITAL_BASED_OUTPATIENT_CLINIC_OR_DEPARTMENT_OTHER): Payer: Medicaid Other | Admitting: Certified Nurse Midwife

## 2023-12-25 VITALS — BP 100/67 | HR 79 | Wt 155.6 lb

## 2023-12-25 DIAGNOSIS — B009 Herpesviral infection, unspecified: Secondary | ICD-10-CM | POA: Diagnosis not present

## 2023-12-25 DIAGNOSIS — Z3A34 34 weeks gestation of pregnancy: Secondary | ICD-10-CM | POA: Diagnosis not present

## 2023-12-25 DIAGNOSIS — Z3480 Encounter for supervision of other normal pregnancy, unspecified trimester: Secondary | ICD-10-CM

## 2023-12-25 DIAGNOSIS — O24410 Gestational diabetes mellitus in pregnancy, diet controlled: Secondary | ICD-10-CM | POA: Diagnosis not present

## 2023-12-26 NOTE — Progress Notes (Signed)
   PRENATAL VISIT NOTE  Subjective:  Mary Hays is a 33 y.o. [phone] at [redacted]w[redacted]d being seen today for ongoing prenatal care.  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  Patient reports no complaints.  Contractions: Irregular. Vag. Bleeding: None.  Movement: Present. Denies leaking of fluid.   The following portions of the patient's history were reviewed and updated as appropriate: allergies, current medications, past family history, past medical history, past social history, past surgical history and problem list.   Objective:   Vitals:   12/25/23 0859  BP: 100/67  Pulse: 79  Weight: 155 lb 9.6 oz (70.6 kg)    Fetal Status: Fetal Heart Rate (bpm): 150 Fundal Height: 34 cm Movement: Present     General:  Alert, oriented and cooperative. Patient is in no acute distress.  Skin: Skin is warm and dry. No rash noted.   Cardiovascular: Normal heart rate noted  Respiratory: Normal respiratory effort, no problems with respiration noted  Abdomen: Soft, gravid, appropriate for gestational age.  Pain/Pressure: Present     Pelvic: Cervical exam deferred        Extremities: Normal range of motion.  Edema: Trace (Hands sometimes)  Mental Status: Normal mood and affect. Normal behavior. Normal judgment and thought content.   Assessment and Plan:  Pregnancy: [id number] at [redacted]w[redacted]d  1. Supervision of other normal pregnancy, antepartum (Primary) - continue PNV and Iron supplementation   2. 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 Please refer to After Visit Summary for other counseling recommendations.     Future Appointments  Date Time Provider Department Center  01/08/2024  8:55 AM Tayvion Lauder, Juvenal Opoka, CNM [facility]  01/15/2024  8:55 AM Lillian Rein, MD [facility]  01/22/2024  8:55 AM Hilda Lovings, Juvenal Opoka, CNM [facility]  01/29/2024  8:55 AM Lillian Rein, MD [facility]  02/05/2024  8:55 AM Lillian Rein, MD [facility]    Yolanda Hence, CNM

## 2024-01-08 ENCOUNTER — Other Ambulatory Visit (HOSPITAL_COMMUNITY)
Admission: RE | Admit: 2024-01-08 | Discharge: 2024-01-08 | Disposition: A | Discharge status: Home / Self Care | Source: Ambulatory Visit | Attending: Certified Nurse Midwife | Admitting: Certified Nurse Midwife

## 2024-01-08 ENCOUNTER — Encounter (HOSPITAL_BASED_OUTPATIENT_CLINIC_OR_DEPARTMENT_OTHER): Payer: Self-pay | Admitting: Certified Nurse Midwife

## 2024-01-08 ENCOUNTER — Ambulatory Visit (HOSPITAL_BASED_OUTPATIENT_CLINIC_OR_DEPARTMENT_OTHER): Payer: Medicaid Other | Admitting: Certified Nurse Midwife

## 2024-01-08 VITALS — BP 113/77 | HR 91 | Wt 159.2 lb

## 2024-01-08 DIAGNOSIS — R102 Pelvic and perineal pain: Secondary | ICD-10-CM | POA: Diagnosis not present

## 2024-01-08 DIAGNOSIS — Z3A36 36 weeks gestation of pregnancy: Secondary | ICD-10-CM | POA: Diagnosis not present

## 2024-01-08 DIAGNOSIS — O26893 Other specified pregnancy related conditions, third trimester: Secondary | ICD-10-CM

## 2024-01-08 DIAGNOSIS — Z3480 Encounter for supervision of other normal pregnancy, unspecified trimester: Secondary | ICD-10-CM | POA: Diagnosis present

## 2024-01-08 MED ORDER — VALACYCLOVIR HCL 500 MG PO TABS
500.0000 mg | ORAL_TABLET | Freq: Two times a day (BID) | ORAL | 1 refills | Status: DC
Start: 2024-01-08 — End: 2024-01-15

## 2024-01-08 NOTE — Progress Notes (Signed)
    PRENATAL VISIT NOTE  Subjective:  Mary Hays is a 33 y.o. [phone] at [redacted]w[redacted]d being seen today for ongoing prenatal care.  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  Patient reports  : Denies vaginal spotting or bleeding. Denies LOF from vagina. Reports pelvic "pressure" intermittently past 4 days, sometimes experiences pain in lower back .  Contractions: Irregular. Vag. Bleeding: None.  Movement: Present. Denies leaking of fluid.   The following portions of the patient's history were reviewed and updated as appropriate: allergies, current medications, past family history, past medical history, past social history, past surgical history and problem list.   Objective:   Vitals:   01/08/24 0900  BP: 113/77  Pulse: 91  Weight: 159 lb 3.2 oz (72.2 kg)    Fetal Status: Fetal Heart Rate (bpm): 140 Fundal Height: 35 cm Movement: Present  Presentation: Vertex  General:  Alert, oriented and cooperative. Patient is in no acute distress.  Skin: Skin is warm and dry. No rash noted.   Cardiovascular: Normal heart rate noted  Respiratory: Normal respiratory effort, no problems with respiration noted  Abdomen: Soft, gravid, appropriate for gestational age.  Pain/Pressure: Present     Pelvic: Cervical exam performed in the presence of a chaperone Dilation: 1 Effacement (%): 60    Extremities: Normal range of motion.  Edema: Trace (hands)  Mental Status: Normal mood and affect. Normal behavior. Normal judgment and thought content.   Assessment and Plan:  Pregnancy: [id number] at [redacted]w[redacted]d  1. Supervision of other normal pregnancy, antepartum (Primary) - Culture, beta  strep (group b only) - Cervicovaginal ancillary only( [place])  2. [redacted] weeks gestation of pregnancy  3. Pelvic pain affecting pregnancy in third trimester, antepartum - Urine Culture  Term labor symptoms and general obstetric precautions including but not limited to vaginal bleeding, contractions, leaking of fluid and fetal movement were reviewed in detail with the patient. Please refer to After Visit Summary for other counseling recommendations.   No follow-ups on file.  Future Appointments  Date Time Provider Department Center  01/15/2024  8:55 AM Lillian Rein, MD [facility]  01/22/2024  8:55 AM Hilda Lovings, Juvenal Opoka, CNM [facility]  01/29/2024  8:55 AM Lillian Rein, MD [facility]  02/05/2024  8:55 AM Lillian Rein, MD [facility]    Yolanda Hence, CNM

## 2024-01-09 LAB — CERVICOVAGINAL ANCILLARY ONLY
Chlamydia: NEGATIVE
Comment: NEGATIVE
Comment: NEGATIVE
Comment: NORMAL
Neisseria Gonorrhea: NEGATIVE
Trichomonas: NEGATIVE

## 2024-01-10 LAB — URINE CULTURE

## 2024-01-11 LAB — CULTURE, BETA STREP (GROUP B ONLY): Strep Gp B Culture: NEGATIVE

## 2024-01-13 ENCOUNTER — Encounter (HOSPITAL_COMMUNITY): Payer: Self-pay | Admitting: Family Medicine

## 2024-01-13 ENCOUNTER — Inpatient Hospital Stay (HOSPITAL_COMMUNITY)
Admission: AD | Admit: 2024-01-13 | Discharge: 2024-01-13 | Disposition: A | Discharge status: Home / Self Care | Attending: Family Medicine | Admitting: Family Medicine

## 2024-01-13 ENCOUNTER — Other Ambulatory Visit: Payer: Self-pay

## 2024-01-13 ENCOUNTER — Inpatient Hospital Stay (HOSPITAL_BASED_OUTPATIENT_CLINIC_OR_DEPARTMENT_OTHER)

## 2024-01-13 ENCOUNTER — Encounter (HOSPITAL_BASED_OUTPATIENT_CLINIC_OR_DEPARTMENT_OTHER): Payer: Self-pay | Admitting: Certified Nurse Midwife

## 2024-01-13 DIAGNOSIS — O368130 Decreased fetal movements, third trimester, not applicable or unspecified: Secondary | ICD-10-CM | POA: Insufficient documentation

## 2024-01-13 DIAGNOSIS — Z3A37 37 weeks gestation of pregnancy: Secondary | ICD-10-CM

## 2024-01-13 DIAGNOSIS — Z3689 Encounter for other specified antenatal screening: Secondary | ICD-10-CM

## 2024-01-13 DIAGNOSIS — Z3493 Encounter for supervision of normal pregnancy, unspecified, third trimester: Secondary | ICD-10-CM

## 2024-01-13 NOTE — MAU Provider Note (Signed)
 Chief Complaint:  Vaginal Pain and Decreased Fetal Movement   HPI   None     Mary Hays is a 33 y.o. [phone] at [redacted]w[redacted]d who presents to maternity admissions reporting DFM since yesterday with 2 movements yesterday and 3 today (one since arrival to MAU). She additionally reports vaginal pain since yesterday. Reports that it is currently better, but is worse on left side and worse with standing.   Pregnancy Course: [facility]  Past Medical History:  Diagnosis Date   Anemia 11/16/2016   Last Assessment & Plan: Formatting of this note might be different from the original. Previous history of anemia.  Ferritin low.  Patient would like recheck.  Is not having any symptoms concerning for anemia currently.  Previous episodes of dizziness but these have resolved.  Evaluate today with CBC and further indices.     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 If baby has disorder it can cause seizures, breathing problems, liver problems, brain damage, coma or death. Is treatable.      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- consider repeat     History of recurrent miscarriages    OB History  Gravida Para Term Preterm AB Living  6 3 3  0 2 3  SAB IAB Ectopic Multiple Live Births  2 0 0 0 3    # Outcome Date GA Lbr Len/2nd Weight Sex Type Anes PTL Lv  6 Current           5 SAB 08/23/22 [redacted]w[redacted]d         4 Term 10/15/19 [redacted]w[redacted]d 01:40 / 00:02 3195 g F Vag-Spont Local  LIV  3 Term 04/14/16 [redacted]w[redacted]d 09:15 / 00:22 3815 g M Vag-Spont None  LIV  2 SAB 07/01/15          1 Term 03/13/13 [redacted]w[redacted]d  2948 g M Vag-Spont EPI  LIV   Past Surgical History:  Procedure Laterality Date   APPENDECTOMY     33 yrs old   Family History  Problem Relation Age of Onset    Diabetes Father    Intellectual disability Sister    Down syndrome Sister    Social History   Tobacco Use   Smoking status: Never   Smokeless tobacco: Never  Vaping Use   Vaping status: Never Used  Substance Use Topics   Alcohol use: No   Drug use: No   No Known Allergies Medications Prior to Admission  Medication Sig Dispense Refill Last Dose/Taking   Prenatal Vit-Fe Fumarate-FA (PRENATAL MULTIVITAMIN) TABS tablet Take 1 tablet by mouth daily.    01/13/2024 Morning   Accu-Chek Softclix Lancets lancets Check blood sugars 4 times daily; fasting and 2 hours after meals 100 each 6    ferrous sulfate  325 (65 FE) MG tablet Take 1 tablet (325 mg total) by mouth daily with breakfast. (Patient not taking: Reported on 01/08/2024) 30 tablet 3    glucose blood (ACCU-CHEK GUIDE TEST) test strip Check blood sugars 4 times daily; fasting and 2 hours after meals 100 each 6    valACYclovir  (VALTREX ) 500 MG tablet Take 1 tablet (500 mg total) by mouth 2 (two) times daily. 60 tablet 1     I have reviewed patient's Past Medical Hx, Surgical  Hx, Family Hx, Social Hx, medications and allergies.   ROS  Pertinent items noted in HPI and remainder of comprehensive ROS otherwise negative.   PHYSICAL EXAM  Patient Vitals for the past 24 hrs:  BP Temp Temp src Pulse Resp SpO2 Height Weight  01/13/24 1851 120/71 99.1 F (37.3 C) Oral 97 16 100 % 5\' 1"  (1.549 m) 81.2 kg    Physical Exam Vitals and nursing note reviewed.  Constitutional:      Appearance: Normal appearance. She is normal weight.  Cardiovascular:     Rate and Rhythm: Normal rate.  Pulmonary:     Effort: Pulmonary effort is normal.  Abdominal:     Comments: gravid  Skin:    General: Skin is warm.     Capillary Refill: Capillary refill takes less than 2 seconds.  Neurological:     General: No focal deficit present.     Mental Status: She is alert and oriented to person, place, and time.  Psychiatric:        Mood and Affect: Mood  normal.        Behavior: Behavior normal.        Thought Content: Thought content normal.        Judgment: Judgment normal.         Fetal Tracing: Baseline: 130 Variability: moderate Accelerations: 15x15 Decelerations:absent Toco: occasional   Labs: No results found for this or any previous visit (from the past 24 hours).  Imaging:  No results found.  MDM & MAU COURSE  MDM: Reviewed records, FHR monitoring, BPP Improved perception of movement, reassuring BPP and NST  MAU Course: Orders Placed This Encounter  Procedures   US  MFM FETAL BPP WO NON STRESS   Discharge patient Discharge disposition: 01-Home or Self Care; Discharge patient date: 01/13/2024   No orders of the defined types were placed in this encounter.   ASSESSMENT   1. Movement of fetus present during pregnancy in third trimester   2. Non-stress test reactive   3. [redacted] weeks gestation of pregnancy   -Reassuring fetal status, BPP 8/8 + NST 10/10 0  PLAN  Discharge home in stable condition.  Return precautions advised.   Has good follow up.   Follow-up Information     Lillian Rein, MD Follow up on 01/15/2024.   Specialty: Obstetrics and Gynecology Contact information: [address] [place] [address] [phone]                 Allergies as of 01/13/2024   No Known Allergies      Medication List     TAKE these medications    Accu-Chek Guide Test test strip Generic drug: glucose blood Check blood sugars 4 times daily; fasting and 2 hours after meals   Accu-Chek Softclix Lancets lancets Check blood sugars 4 times daily; fasting and 2 hours after meals   ferrous sulfate  325 (65 FE) MG tablet Take 1 tablet (325 mg total) by mouth daily with breakfast.   prenatal multivitamin Tabs tablet Take 1 tablet by mouth daily.   valACYclovir  500 MG tablet Commonly known as: Valtrex  Take 1 tablet (500 mg total) by mouth 2 (two) times daily.        Raford Bunk, MSN, CNM 01/13/2024 9:02 PM  Certified Nurse Midwife, [facility]

## 2024-01-13 NOTE — MAU Note (Signed)
 Mary Hays is a 33 y.o. at [redacted]w[redacted]d here in MAU reporting: only 1 weak movement today and vaginal pain. NO c/o SROM, vaginal bleeding, contractions, HA, chest pain, or visual disturbances. EFM explained and applied to soft non tender abd. Physical begun.   LMP: na Onset of complaint: 0600 Pain score: 6/10 Vitals:   01/13/24 1851  BP: 120/71  Pulse: 97  Resp: 16  Temp: 99.1 F (37.3 C)  SpO2: 100%     FHT: 143  Lab orders placed from triage: na

## 2024-01-15 ENCOUNTER — Ambulatory Visit (HOSPITAL_BASED_OUTPATIENT_CLINIC_OR_DEPARTMENT_OTHER): Payer: Medicaid Other | Admitting: Obstetrics & Gynecology

## 2024-01-15 VITALS — BP 110/71 | HR 88 | Wt 160.0 lb

## 2024-01-15 DIAGNOSIS — B009 Herpesviral infection, unspecified: Secondary | ICD-10-CM | POA: Diagnosis not present

## 2024-01-15 DIAGNOSIS — Z3A37 37 weeks gestation of pregnancy: Secondary | ICD-10-CM

## 2024-01-15 DIAGNOSIS — R898 Other abnormal findings in specimens from other organs, systems and tissues: Secondary | ICD-10-CM

## 2024-01-15 DIAGNOSIS — Z3480 Encounter for supervision of other normal pregnancy, unspecified trimester: Secondary | ICD-10-CM

## 2024-01-15 DIAGNOSIS — O24410 Gestational diabetes mellitus in pregnancy, diet controlled: Secondary | ICD-10-CM

## 2024-01-15 MED ORDER — VALACYCLOVIR HCL 500 MG PO TABS: 500.0000 mg | ORAL_TABLET | Freq: Two times a day (BID) | ORAL | Status: AC

## 2024-01-18 ENCOUNTER — Encounter (HOSPITAL_BASED_OUTPATIENT_CLINIC_OR_DEPARTMENT_OTHER): Payer: Self-pay | Admitting: Obstetrics & Gynecology

## 2024-01-18 NOTE — Progress Notes (Signed)
   PRENATAL VISIT NOTE  Subjective:  Mary Hays is a 33 y.o. [phone] at [redacted]w[redacted]d being seen today for ongoing prenatal care.  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  Patient reports no complaints.  She was seen in MAU due to decreased fetal movement on 5/12.  BPP 8/8 noted.  Movement has improved since that time.  Contractions: Irregular. Vag. Bleeding: None.  Movement: Present. Denies leaking of fluid.   Has been checking blood sugars.  Did not bring with her but reports they are all normal.    The following portions of the patient's history were reviewed and updated as appropriate: allergies, current medications, past family history, past medical history, past social history, past surgical history and problem list.   Objective:   Vitals:   01/15/24 0856  BP: 110/71  Pulse: 88  Weight: 160 lb (72.6 kg)    Fetal Status: Fetal Heart Rate (bpm): 135 Fundal Height: 36 cm Movement: Present  Presentation: Vertex  General:                 Alert, oriented and cooperative. Patient is in no acute distress.  Skin: Skin is warm and dry. No rash noted.   Cardiovascular: Normal heart rate noted  Respiratory: Normal respiratory effort, no problems with respiration noted  Abdomen: Soft, gravid, appropriate for gestational age.  Pain/Pressure: Present     Pelvic: Cervical exam performed in the presence of a chaperone Dilation: 1 Effacement (%): 30 Station: -3 (ballotable)  Extremities: Normal range of motion.  Edema: Trace  Mental Status: Normal mood and affect. Normal behavior. Normal judgment and thought content.   Assessment and Plan:  Pregnancy:  [id number] at [redacted]w[redacted]d 1. [redacted] weeks gestation of pregnancy (Primary) - on PNV - recheck 1 week with Abe Abed Lo  2. Supervision of other normal pregnancy, antepartum  3. 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  Has Rx.  Importance discussed.    4. 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 Diet controlled gestational diabetes mellitus (GDM) in third trimester - blood sugars are normal per pt - induction will be scheduled at 39 weeks  Term labor symptoms and general obstetric precautions including but not limited to vaginal bleeding, contractions, leaking of fluid and fetal movement were reviewed in detail with the patient. Please refer to After Visit Summary for other counseling recommendations.   Return in about 1 week (around 01/22/2024).  Future Appointments  Date Time Provider Department Center  01/20/2024  1:35 PM Yolanda Hence, CNM [facility]  01/27/2024 12:00 AM [facility] SCHED ROOM [facility] None    Lillian Rein, MD

## 2024-01-20 ENCOUNTER — Ambulatory Visit (HOSPITAL_BASED_OUTPATIENT_CLINIC_OR_DEPARTMENT_OTHER): Admitting: Certified Nurse Midwife

## 2024-01-20 VITALS — BP 115/61 | HR 66 | Wt 159.8 lb

## 2024-01-20 DIAGNOSIS — O24410 Gestational diabetes mellitus in pregnancy, diet controlled: Secondary | ICD-10-CM | POA: Diagnosis not present

## 2024-01-20 DIAGNOSIS — Z3A38 38 weeks gestation of pregnancy: Secondary | ICD-10-CM

## 2024-01-20 DIAGNOSIS — Z3480 Encounter for supervision of other normal pregnancy, unspecified trimester: Secondary | ICD-10-CM

## 2024-01-20 NOTE — Progress Notes (Addendum)
      PRENATAL VISIT NOTE  Subjective:  Mary Hays is a 33 y.o. [phone] at [redacted]w[redacted]d being seen today for ongoing prenatal care.  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  Patient reports no bleeding, no contractions, no cramping, and no leaking.  Contractions: Irregular. Vag. Bleeding: None.  Movement: Present. Denies leaking of fluid.   The following portions of the patient's history were reviewed and updated as appropriate: allergies, current medications, past family history, past medical history, past social history, past surgical history and problem list.   Objective:   Vitals:   01/20/24 1409  BP: 115/61  Pulse: 66  Weight: 159 lb 12.8 oz (72.5 kg)    Fetal Status:  Fetal Heart Rate (bpm): 135   Movement: Present    General: Alert, oriented and cooperative. Patient is in no acute distress.  Skin: Skin is warm and dry. No rash noted.   Cardiovascular: Normal heart rate noted  Respiratory: Normal respiratory effort, no problems with respiration noted  Abdomen: Soft, gravid, appropriate for gestational age.  Pain/Pressure: Present     Pelvic:         Extremities: Normal range of motion.  Edema: Trace  Mental Status: Normal mood and affect. Normal behavior. Normal judgment and thought content.   Assessment and Plan:  Pregnancy: [id number] at [redacted]w[redacted]d  1. [redacted] weeks gestation of pregnancy (Primary) - on PNV - recheck 1 week with [name] - US  (01/13/24): Vtx, anterior placenta, AFI 13 (49%), BPP 8/8.  2. Supervision of other normal pregnancy, antepartum   3. 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 Matenal carrier for  Medium Chain Acyl-CoA Dehydrogenase deficiency - done in 2020 on [employer] carrier testing - husband did not get tested   5. Diet controlled gestational diabetes mellitus (GDM) in third trimester - blood sugars are normal per pt - induction will be scheduled at 39 weeks - US  (01/13/24): Vtx, anterior placenta, AFI 13 (49%), BPP 8/8. - NST Reactive.  Addendum:  AFI performed by Dr. Annabell Key: 14.0.     Term labor symptoms and general obstetric precautions including but not limited to vaginal bleeding, contractions, leaking of fluid and fetal movement were reviewed in detail with the patient. Please refer to After Visit Summary for other counseling recommendations.   No follow-ups on file.  Future Appointments  Date Time Provider Department Center  01/27/2024 12:00 AM [facility] SCHED ROOM [facility] None    Yolanda Hence, CNM

## 2024-01-22 ENCOUNTER — Encounter (HOSPITAL_BASED_OUTPATIENT_CLINIC_OR_DEPARTMENT_OTHER): Payer: Medicaid Other | Admitting: Certified Nurse Midwife

## 2024-01-27 ENCOUNTER — Inpatient Hospital Stay (HOSPITAL_COMMUNITY)

## 2024-01-27 ENCOUNTER — Encounter (HOSPITAL_COMMUNITY)

## 2024-01-28 ENCOUNTER — Inpatient Hospital Stay (HOSPITAL_COMMUNITY)

## 2024-01-28 ENCOUNTER — Inpatient Hospital Stay (HOSPITAL_COMMUNITY)
Admission: RE | Admit: 2024-01-28 | Discharge: 2024-01-29 | DRG: 806 | Disposition: A | Discharge status: Home / Self Care | Payer: Self-pay | Attending: Family Medicine | Admitting: Family Medicine

## 2024-01-28 ENCOUNTER — Other Ambulatory Visit: Payer: Self-pay

## 2024-01-28 ENCOUNTER — Encounter (HOSPITAL_COMMUNITY): Payer: Self-pay | Admitting: Family Medicine

## 2024-01-28 DIAGNOSIS — O9832 Other infections with a predominantly sexual mode of transmission complicating childbirth: Secondary | ICD-10-CM | POA: Diagnosis present

## 2024-01-28 DIAGNOSIS — O99284 Endocrine, nutritional and metabolic diseases complicating childbirth: Secondary | ICD-10-CM | POA: Diagnosis present

## 2024-01-28 DIAGNOSIS — Z3A39 39 weeks gestation of pregnancy: Secondary | ICD-10-CM | POA: Diagnosis not present

## 2024-01-28 DIAGNOSIS — O24420 Gestational diabetes mellitus in childbirth, diet controlled: Secondary | ICD-10-CM | POA: Diagnosis present

## 2024-01-28 DIAGNOSIS — O9081 Anemia of the puerperium: Secondary | ICD-10-CM | POA: Diagnosis not present

## 2024-01-28 DIAGNOSIS — E71311 Medium chain acyl CoA dehydrogenase deficiency: Secondary | ICD-10-CM | POA: Diagnosis present

## 2024-01-28 DIAGNOSIS — D62 Acute posthemorrhagic anemia: Secondary | ICD-10-CM | POA: Diagnosis not present

## 2024-01-28 DIAGNOSIS — O24419 Gestational diabetes mellitus in pregnancy, unspecified control: Secondary | ICD-10-CM | POA: Diagnosis present

## 2024-01-28 DIAGNOSIS — A6000 Herpesviral infection of urogenital system, unspecified: Secondary | ICD-10-CM | POA: Diagnosis present

## 2024-01-28 DIAGNOSIS — R898 Other abnormal findings in specimens from other organs, systems and tissues: Secondary | ICD-10-CM | POA: Diagnosis present

## 2024-01-28 DIAGNOSIS — Z3480 Encounter for supervision of other normal pregnancy, unspecified trimester: Principal | ICD-10-CM

## 2024-01-28 DIAGNOSIS — Z8759 Personal history of other complications of pregnancy, childbirth and the puerperium: Secondary | ICD-10-CM

## 2024-01-28 DIAGNOSIS — Z833 Family history of diabetes mellitus: Secondary | ICD-10-CM | POA: Diagnosis not present

## 2024-01-28 LAB — TYPE AND SCREEN
ABO/RH(D): O POS
Antibody Screen: NEGATIVE

## 2024-01-28 LAB — CBC
HCT: 36.3 % (ref 36.0–46.0)
Hemoglobin: 11.2 g/dL — ABNORMAL LOW (ref 12.0–15.0)
MCH: 23.1 pg — ABNORMAL LOW (ref 26.0–34.0)
MCHC: 30.9 g/dL (ref 30.0–36.0)
MCV: 75 fL — ABNORMAL LOW (ref 80.0–100.0)
Platelets: 196 10*3/uL (ref 150–400)
RBC: 4.84 MIL/uL (ref 3.87–5.11)
RDW: 17 % — ABNORMAL HIGH (ref 11.5–15.5)
WBC: 11.2 10*3/uL — ABNORMAL HIGH (ref 4.0–10.5)
nRBC: 0 % (ref 0.0–0.2)

## 2024-01-28 LAB — RPR: RPR Ser Ql: NONREACTIVE

## 2024-01-28 MED ORDER — OXYTOCIN BOLUS FROM INFUSION
333.0000 mL | Freq: Once | INTRAVENOUS | Status: AC
  Administered 2024-01-28: 333 mL via INTRAVENOUS

## 2024-01-28 MED ORDER — PRENATAL MULTIVITAMIN CH
1.0000 | ORAL_TABLET | Freq: Every day | ORAL | Status: DC
  Administered 2024-01-29: 1 via ORAL
  Filled 2024-01-28: qty 1

## 2024-01-28 MED ORDER — OXYCODONE-ACETAMINOPHEN 5-325 MG PO TABS: 2.0000 | ORAL_TABLET | ORAL | Status: DC | PRN

## 2024-01-28 MED ORDER — SOD CITRATE-CITRIC ACID 500-334 MG/5ML PO SOLN: 30.0000 mL | ORAL | Status: DC | PRN

## 2024-01-28 MED ORDER — BENZOCAINE-MENTHOL 20-0.5 % EX AERO
1.0000 | INHALATION_SPRAY | CUTANEOUS | Status: DC | PRN
  Filled 2024-01-28: qty 56

## 2024-01-28 MED ORDER — OXYCODONE HCL 5 MG PO TABS: 10.0000 mg | ORAL_TABLET | ORAL | Status: DC | PRN

## 2024-01-28 MED ORDER — FERROUS SULFATE 325 (65 FE) MG PO TABS
325.0000 mg | ORAL_TABLET | Freq: Every day | ORAL | Status: DC
  Administered 2024-01-29: 325 mg via ORAL
  Filled 2024-01-28: qty 1

## 2024-01-28 MED ORDER — SODIUM CHLORIDE 0.9 % IV SOLN
250.0000 mL | INTRAVENOUS | Status: DC | PRN
Start: 2024-01-28 — End: 2024-01-30

## 2024-01-28 MED ORDER — VALACYCLOVIR HCL 500 MG PO TABS: 500.0000 mg | ORAL_TABLET | Freq: Two times a day (BID) | ORAL | Status: DC

## 2024-01-28 MED ORDER — ACETAMINOPHEN 325 MG PO TABS: 650.0000 mg | ORAL_TABLET | ORAL | Status: DC | PRN

## 2024-01-28 MED ORDER — SODIUM CHLORIDE 0.9% FLUSH
3.0000 mL | Freq: Two times a day (BID) | INTRAVENOUS | Status: DC
  Administered 2024-01-28: 3 mL via INTRAVENOUS

## 2024-01-28 MED ORDER — LIDOCAINE HCL (PF) 1 % IJ SOLN
30.0000 mL | INTRAMUSCULAR | Status: AC | PRN
  Administered 2024-01-28: 30 mL via SUBCUTANEOUS
  Filled 2024-01-28: qty 30

## 2024-01-28 MED ORDER — ONDANSETRON HCL 4 MG PO TABS: 4.0000 mg | ORAL_TABLET | ORAL | Status: DC | PRN

## 2024-01-28 MED ORDER — WITCH HAZEL-GLYCERIN EX PADS
1.0000 | MEDICATED_PAD | CUTANEOUS | Status: DC | PRN
Start: 2024-01-28 — End: 2024-01-30

## 2024-01-28 MED ORDER — LACTATED RINGERS IV SOLN: 500.0000 mL | INTRAVENOUS | Status: DC | PRN

## 2024-01-28 MED ORDER — ONDANSETRON HCL 4 MG/2ML IJ SOLN: 4.0000 mg | INTRAMUSCULAR | Status: DC | PRN

## 2024-01-28 MED ORDER — IBUPROFEN 600 MG PO TABS
600.0000 mg | ORAL_TABLET | Freq: Four times a day (QID) | ORAL | Status: DC
Start: 2024-01-28 — End: 2024-01-30
  Administered 2024-01-28 – 2024-01-29 (×4): 600 mg via ORAL
  Filled 2024-01-28 (×4): qty 1

## 2024-01-28 MED ORDER — SODIUM CHLORIDE 0.9% FLUSH: 3.0000 mL | INTRAVENOUS | Status: DC | PRN

## 2024-01-28 MED ORDER — MEASLES, MUMPS & RUBELLA VAC IJ SOLR: 0.5000 mL | Freq: Once | INTRAMUSCULAR | Status: DC

## 2024-01-28 MED ORDER — DIBUCAINE (PERIANAL) 1 % EX OINT: 1.0000 | TOPICAL_OINTMENT | CUTANEOUS | Status: DC | PRN

## 2024-01-28 MED ORDER — OXYCODONE HCL 5 MG PO TABS
5.0000 mg | ORAL_TABLET | ORAL | Status: DC | PRN
  Administered 2024-01-29 (×2): 5 mg via ORAL
  Filled 2024-01-28 (×3): qty 1

## 2024-01-28 MED ORDER — TETANUS-DIPHTH-ACELL PERTUSSIS 5-2.5-18.5 LF-MCG/0.5 IM SUSY: 0.5000 mL | PREFILLED_SYRINGE | Freq: Once | INTRAMUSCULAR | Status: DC

## 2024-01-28 MED ORDER — SIMETHICONE 80 MG PO CHEW: 80.0000 mg | CHEWABLE_TABLET | ORAL | Status: DC | PRN

## 2024-01-28 MED ORDER — DOCUSATE SODIUM 100 MG PO CAPS
100.0000 mg | ORAL_CAPSULE | Freq: Two times a day (BID) | ORAL | Status: DC
  Administered 2024-01-29: 100 mg via ORAL
  Filled 2024-01-28: qty 1

## 2024-01-28 MED ORDER — LACTATED RINGERS IV SOLN: INTRAVENOUS | Status: DC

## 2024-01-28 MED ORDER — ONDANSETRON HCL 4 MG/2ML IJ SOLN: 4.0000 mg | Freq: Four times a day (QID) | INTRAMUSCULAR | Status: DC | PRN

## 2024-01-28 MED ORDER — OXYTOCIN-SODIUM CHLORIDE 30-0.9 UT/500ML-% IV SOLN
2.5000 [IU]/h | INTRAVENOUS | Status: DC
  Filled 2024-01-28: qty 500

## 2024-01-28 MED ORDER — TERBUTALINE SULFATE 1 MG/ML IJ SOLN: 0.2500 mg | Freq: Once | INTRAMUSCULAR | Status: DC | PRN

## 2024-01-28 MED ORDER — OXYCODONE-ACETAMINOPHEN 5-325 MG PO TABS: 1.0000 | ORAL_TABLET | ORAL | Status: DC | PRN

## 2024-01-28 MED ORDER — SENNOSIDES-DOCUSATE SODIUM 8.6-50 MG PO TABS
2.0000 | ORAL_TABLET | ORAL | Status: DC
  Administered 2024-01-29: 2 via ORAL
  Filled 2024-01-28: qty 2

## 2024-01-28 MED ORDER — FENTANYL CITRATE (PF) 100 MCG/2ML IJ SOLN
100.0000 ug | INTRAMUSCULAR | Status: DC | PRN
  Administered 2024-01-28 (×2): 100 ug via INTRAVENOUS
  Filled 2024-01-28: qty 2

## 2024-01-28 MED ORDER — ZOLPIDEM TARTRATE 5 MG PO TABS: 5.0000 mg | ORAL_TABLET | Freq: Every evening | ORAL | Status: DC | PRN

## 2024-01-28 MED ORDER — DIPHENHYDRAMINE HCL 25 MG PO CAPS: 25.0000 mg | ORAL_CAPSULE | Freq: Four times a day (QID) | ORAL | Status: DC | PRN

## 2024-01-28 MED ORDER — COCONUT OIL OIL: 1.0000 | TOPICAL_OIL | Status: DC | PRN

## 2024-01-28 MED ORDER — FENTANYL CITRATE (PF) 100 MCG/2ML IJ SOLN
INTRAMUSCULAR | Status: AC
  Filled 2024-01-28: qty 2

## 2024-01-28 MED ORDER — MISOPROSTOL 50MCG HALF TABLET
50.0000 ug | ORAL_TABLET | ORAL | Status: DC | PRN
  Administered 2024-01-28: 50 ug via VAGINAL
  Filled 2024-01-28: qty 1

## 2024-01-28 NOTE — Discharge Summary (Signed)
 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 yo [id number] at [redacted]w[redacted]d was admitted to the hospital 01/28/2024 for induction of labor.  Indication for induction: A1 DM.  Patient had an uncomplicated labor course. Membrane Rupture Time/Date: 2:44 PM,01/28/2024  Delivery Method:  Operative Delivery:N/A Episiotomy:   Lacerations:    Details of delivery can be found in separate delivery note.  Patient had a postpartum course complicated by***. Patient is discharged home 01/28/24.  Newborn Data: Birth date:01/28/2024 Birth time:5:52 PM Gender:Female Living status:  Apgars: ,  Weight:3290 g  Magnesium Sulfate received: {Mag received:30440022} BMZ received: No Rhophylac:No MMR:Yes T-DaP:Declined Flu: No RSV Vaccine received: No Transfusion:{Transfusion received:30440034}  Immunizations received: There is no immunization history for the selected administration types on file for this patient.  Physical exam  Vitals:   01/28/24 1309 01/28/24 1557 01/28/24 1801 01/28/24 1815  BP: 118/66 118/62 108/66 116/63  Pulse:  78 80 75 82  Resp: 14 16  14   Temp:  98.8 F (37.1 C)    TempSrc:  Oral    Weight:      Height:       General: {Exam; general:21111117} Lochia: {Desc; appropriate/inappropriate:30686::"appropriate"} Uterine Fundus: {Desc; firm/soft:30687} Incision: {Exam; incision:21111123} DVT Evaluation: {Exam; dvt:2111122} Labs: Lab Results  Component Value Date   WBC 11.2 (H) 01/28/2024   HGB 11.2 (L) 01/28/2024   HCT 36.3 01/28/2024   MCV 75.0 (L) 01/28/2024   PLT 196 01/28/2024      Latest Ref Rng & Units 08/23/2022    9:02 AM  CMP  Glucose 70 - 99 mg/dL 93   BUN 6 - 20 mg/dL 7   Creatinine 5.62 - 1.30 mg/dL 8.65   Sodium 784 - 696 mmol/L 138   Potassium 3.5 - 5.1 mmol/L 3.7   Chloride 98 - 111 mmol/L 102   CO2 22 - 32 mmol/L 22   Calcium 8.9 - 10.3 mg/dL 9.6    Edinburgh Score:    11/12/2019    2:15 PM  Edinburgh Postnatal Depression Scale Screening Tool  I have been able to laugh and see the funny side of things. 0  I have looked forward with enjoyment to things. 0  I have blamed myself unnecessarily when things went wrong. 0  I have been anxious or worried for no good reason. 0  I have felt scared or panicky for no good reason. 0  Things have been getting on top of me. 0  I have been so unhappy that I have had difficulty sleeping. 0  I have felt  sad or miserable. 0  I have been so unhappy that I have been crying. 0  The thought of harming myself has occurred to me. 0  Edinburgh Postnatal Depression Scale Total 0   No data recorded  After visit meds:  Allergies as of 01/28/2024   No Known Allergies   Med Rec must be completed prior to using this [facility]***        Discharge home in stable condition Infant Feeding: Breast Infant Disposition:{CHL IP OB HOME WITH ZOXWRU:04540} Discharge instruction: per After Visit Summary and Postpartum booklet. Activity: Advance as tolerated. Pelvic rest for 6 weeks.  Diet: routine diet Future Appointments:No future  appointments. Follow up Visit:  Follow-up Information     [facility]. Schedule an appointment as soon as possible for a visit.   Why: Routine post partum follow up in 4-6 weeks Contact information: [address] [place]  [phone]               Message sent to [facility] 5/27  Please schedule this patient for a In person postpartum visit in 4 weeks with the following provider: Any provider. Additional Postpartum F/U:2 hour GTT  High risk pregnancy complicated by: GDM Delivery mode:   NSVD Anticipated Birth Control:  Unsure, considering Copper IUD   01/28/2024 Pernie Grosso, MD

## 2024-01-28 NOTE — H&P (Signed)
 OBSTETRIC ADMISSION HISTORY AND PHYSICAL  Mary Hays is a 33 y.o. female [phone] with IUP at [redacted]w[redacted]d by LMP presenting for IOL A1GDM. She reports +FMs, No LOF, no VB, no blurry vision, headaches or peripheral edema, and RUQ pain.  She plans on breast feeding. She's unsure for birth control. 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  Positive carrier for med chain acytyl co a dehydrogenase def  Rhogam  O/Positive/-- (11/06 1107) A1C/GTT Early: Normal  Third trimester:  89, 194, 153  Flu Vaccine declined      TDaP Vaccine  Declined, discussed 2/26 Blood Type O/Positive/-- (11/06 1107)  Covid Vaccine declined Antibody Negative (11/06 1107)  RSV Vaccine Not needed due to due date Rubella 4.37 (11/06 1107)  Feeding Plan breastfeeding RPR Non Reactive (11/06 1107)  Contraception Copper IUD, considering HBsAg Negative (11/06  1107)  Circumcision Yes, IP HIV Non Reactive (11/06 1107)  Pediatrician  [facility] HCVAb  Neg  Prenatal Classes Info given          Pap       Diagnosis  Date Value Ref Range Status  09/25/2022     Final    - Negative for intraepithelial lesion or malignancy (NILM)    BTL Consent   GC/CT Initial:  neg/neg 36wks:    VBAC Consent   GBS For PCN allergy, check sensitivities            DME Rx [x]  BP cuff [ ]  Weight Scale Waterbirth  [ ]  Class [ ]  Consent [ ]  CNM visit  PHQ9 & GAD7 [x]  new OB [x]  28 weeks  [  ] 36 weeks Induction  [ ]  Orders Entered [ ] Foley Y/N     Past Medical History: Past Medical History:  Diagnosis Date   Anemia 11/16/2016   Last Assessment & Plan: Formatting of this note might be different from the original. Previous history of anemia.  Ferritin low.  Patient would like recheck.  Is not having any symptoms concerning for anemia currently.  Previous episodes of dizziness but these have resolved.  Evaluate today with CBC and further indices.     Genetic carrier status 04/27/2019   + Medium Chain Acyl-CoA Dehydrogenase Deficiency [x] GC--scheduled for 9/30 [ ]   FOB testing neg  Autosomal recessive. If baby has disorder it can cause seizures, breathing problems, liver problems, brain damage, coma or death. Is treatable.      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- consider repeat     History of recurrent miscarriages     Past Surgical History: Past Surgical History:  Procedure Laterality Date   APPENDECTOMY     33 yrs old    Obstetrical History: OB History     Gravida  6   Para  3   Term  3   Preterm  0   AB  2   Living  3      SAB  2   IAB  0   Ectopic  0   Multiple  0   Live Births  3           Social History Social History   Socioeconomic History   Marital status:  Married    Spouse name: Not on file   Number of children: Not on file   Years of education: Not on file   Highest education level: Not on file  Occupational History   Not on file  Tobacco Use   Smoking status: Never   Smokeless tobacco: Never  Vaping Use   Vaping status: Never Used  Substance and Sexual Activity   Alcohol use: No   Drug use: No   Sexual activity: Yes    Birth control/protection: None  Other Topics Concern   Not on file  Social History Narrative   Not on file   Social Drivers of Health   Financial Resource Strain: Low Risk  (07/10/2023)   Overall Financial Resource Strain (CARDIA)    Difficulty of Paying Living Expenses: Not hard at all  Food Insecurity: No Food Insecurity (01/13/2024)   Hunger Vital Sign    Worried About Running Out of Food in the Last Year: Never true    Ran Out of Food in the Last Year: Never true  Transportation Needs: No Transportation Needs (01/13/2024)   PRAPARE - Administrator, Civil Service (Medical): No    Lack of Transportation (Non-Medical): No  Physical Activity: Insufficiently Active (07/10/2023)   Exercise Vital Sign    Days of Exercise per Week: 1 day    Minutes of Exercise per Session: 20 min  Stress: No Stress Concern Present (07/10/2023)   [employer] of Occupational Health - Occupational Stress Questionnaire    Feeling of Stress : Not at all  Social Connections: Moderately Isolated (07/10/2023)   Social Connection and Isolation Panel [NHANES]    Frequency of Communication with Friends and Family: More than three times a week    Frequency of Social Gatherings with Friends and Family: Twice a week    Attends Religious Services: Never    Database administrator or Organizations: No    Attends Engineer, structural: Never    Marital Status: Married    Family History: Family History  Problem Relation Age of Onset   Diabetes Father    Intellectual disability Sister    Down syndrome Sister      Allergies: No Known Allergies  Medications Prior to Admission  Medication Sig Dispense Refill Last Dose/Taking   Prenatal Vit-Fe Fumarate-FA (PRENATAL MULTIVITAMIN) TABS tablet Take 1 tablet by mouth daily.  01/27/2024   valACYclovir  (VALTREX ) 500 MG tablet Take 1 tablet (500 mg total) by mouth 2 (two) times daily.   Past Week   Accu-Chek Softclix Lancets lancets Check blood sugars 4 times daily; fasting and 2 hours after meals (Patient not taking: Reported on 01/20/2024) 100 each 6    ferrous sulfate  325 (65 FE) MG tablet Take 1 tablet (325 mg total) by mouth daily with breakfast. (Patient not taking: Reported on 01/15/2024) 30 tablet 3 Not Taking   glucose blood (ACCU-CHEK GUIDE TEST) test strip Check blood sugars 4 times daily; fasting and 2 hours after meals (Patient not taking: Reported on 01/20/2024) 100 each 6      Review of Systems   All systems reviewed and negative except as stated in HPI  Last menstrual period 04/29/2023. General appearance: appears stated age Lungs: clear to auscultation bilaterally Heart: regular rate and rhythm Abdomen: soft, non-tender; bowel sounds normal Pelvic: normal female genitalia  Extremities: Homans sign is negative, no sign of DVT Presentation: cephalic BSUS Fetal monitoringBaseline: 130 bpm, Variability: Good {> 6 bpm), Accelerations: Reactive, and Decelerations: Absent Uterine activity irregular     Prenatal labs: ABO, Rh: O/Positive/-- (11/06 1107) Antibody: Negative (11/06 1107) Rubella: 4.37 (11/06 1107) RPR: Non Reactive (02/26 0834)  HBsAg: Negative (11/06 1107)  HIV: Non Reactive (02/26 0834)  GBS: Negative/-- (05/07 1003)    Lab Results  Component Value Date   GBS Negative 01/08/2024   GTT nrl early, failed 3rd trimester Genetic screening  Hor neg13/14, + carrier for med chain acytyl CoA dehydrogenase def, FOB not tested, NIPS LR Female, AFP neg Anatomy US  nrl  There is no immunization history for the selected  administration types on file for this patient.  Prenatal Transfer Tool  Maternal Diabetes: Yes:  Diabetes Type:  Diet controlled Genetic Screening: Abnormal:  Results: Other: +Med chain acytyl CoA DH def carrier Maternal Ultrasounds/Referrals: Normal Fetal Ultrasounds or other Referrals:  None Maternal Substance Abuse:  No Significant Maternal Medications:  Meds include: Other:  Valtrex   Significant Maternal Lab Results: Group B Strep negative Number of Prenatal Visits:greater than 3 verified prenatal visits Maternal Vaccinations:declined Other Comments:  None   No results found for this or any previous visit (from the past 24 hours).  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 [id number] at [redacted]w[redacted]d here for IOL A1GDM  #Labor: Vaginal cytotec   #Pain: Per patient request #FWB: Cat 1 #GBS status:  negative #Feeding: Breastmilk  #Reproductive Life planning: Undecided #Circ:  yes  #A1GDM: @[redacted]w[redacted]d , CWD, normal anatomy, breech presentation, anterior placental lie, 280g, 41% EFW, @[redacted]w[redacted]d  cephalic presentation, anterior placenta, AFI nrl, BPP 8/8 - CBGs Q4hrs   #HSV: reports taking Valtrex  since 36wks, no prodromal symptoms - Continue Valtrex  through labor   #Family Hx of DS: Maternal Sister #Maternal Carrier of Med Chain Acycl-CoA Dehydrogenase def, FOB not tested  Ebony Goldstein, MD  01/28/2024, 9:41 AM

## 2024-01-28 NOTE — Progress Notes (Signed)
 LABOR PROGRESS NOTE  Patient Name: Mary Hays, female   DOB: Dec 25, 1990, 33 y.o.  MRN: [id number]  Pt feeling more intense contractions, every 2-3 minutes for the last few hours. Discussed role, risks, and benefits of AROM; pt agreeable.   Blood pressure 118/66, pulse 78, temperature 98 F (36.7 C), temperature source Oral, resp. rate 14, height 5\' 1"  (1.549 m), weight 71.9 kg, last menstrual period 04/29/2023.  Dilation: 2 Effacement (%): 40 Station: -1 Presentation: Vertex Exam by:: Dr. Cooper Denver  EFM: baseline 150, accels, no decels, moderate variability TOCO: q2-46min contractions  AROM clear.  Pt hoping to avoid pitocin .  Darrow End, MD

## 2024-01-28 NOTE — Lactation Note (Signed)
 This note was copied from a baby's chart. Lactation Consultation Note  Patient Name: Mary Hays [id number] Date: 01/28/2024 Age:32 hours Reason for consult: Initial assessment  MOB declines all lactation services while in the hospital. Please let the [facility] team know if MOB is requiring our services at anytime.   Feeding Mother's Current Feeding Choice: Breast Milk  Consult Status Consult Status: Complete (mother declined follow up) Date: 01/28/24    Vernette Goo BS, IBCLC 01/28/2024, 6:58 PM

## 2024-01-29 ENCOUNTER — Other Ambulatory Visit (HOSPITAL_COMMUNITY): Payer: Self-pay

## 2024-01-29 ENCOUNTER — Encounter (HOSPITAL_BASED_OUTPATIENT_CLINIC_OR_DEPARTMENT_OTHER): Payer: Medicaid Other | Admitting: Obstetrics & Gynecology

## 2024-01-29 LAB — CBC
HCT: 33 % — ABNORMAL LOW (ref 36.0–46.0)
Hemoglobin: 10.4 g/dL — ABNORMAL LOW (ref 12.0–15.0)
MCH: 23.8 pg — ABNORMAL LOW (ref 26.0–34.0)
MCHC: 31.5 g/dL (ref 30.0–36.0)
MCV: 75.5 fL — ABNORMAL LOW (ref 80.0–100.0)
Platelets: 181 10*3/uL (ref 150–400)
RBC: 4.37 MIL/uL (ref 3.87–5.11)
RDW: 17.2 % — ABNORMAL HIGH (ref 11.5–15.5)
WBC: 13.1 10*3/uL — ABNORMAL HIGH (ref 4.0–10.5)
nRBC: 0 % (ref 0.0–0.2)

## 2024-01-29 MED ORDER — IBUPROFEN 600 MG PO TABS: 600.0000 mg | ORAL_TABLET | Freq: Four times a day (QID) | ORAL | 0 refills | Status: AC

## 2024-01-29 MED ORDER — COCONUT OIL OIL: 1.0000 | TOPICAL_OIL | Status: AC | PRN

## 2024-01-29 MED ORDER — OXYCODONE HCL 5 MG PO TABS
5.0000 mg | ORAL_TABLET | ORAL | 0 refills | Status: DC | PRN
  Filled 2024-01-29: qty 5, 1d supply, fill #0

## 2024-01-29 MED ORDER — WITCH HAZEL-GLYCERIN EX PADS: 1.0000 | MEDICATED_PAD | CUTANEOUS | Status: AC | PRN

## 2024-01-29 MED ORDER — IBUPROFEN 600 MG PO TABS
600.0000 mg | ORAL_TABLET | Freq: Four times a day (QID) | ORAL | 0 refills | Status: DC
  Filled 2024-01-29: qty 30, 8d supply, fill #0

## 2024-01-29 MED ORDER — OXYCODONE HCL 5 MG PO TABS: 5.0000 mg | ORAL_TABLET | ORAL | 0 refills | Status: AC | PRN

## 2024-01-29 MED ORDER — BENZOCAINE-MENTHOL 20-0.5 % EX AERO: 1.0000 | INHALATION_SPRAY | CUTANEOUS | Status: AC | PRN

## 2024-01-29 MED ORDER — FERROUS SULFATE 325 (65 FE) MG PO TABS: 325.0000 mg | ORAL_TABLET | ORAL | Status: DC

## 2024-01-29 MED ORDER — ACETAMINOPHEN 325 MG PO TABS: 650.0000 mg | ORAL_TABLET | ORAL | Status: AC | PRN

## 2024-01-29 NOTE — Lactation Note (Signed)
 This note was copied from a baby's chart. Lactation Consultation Note  Patient Name: Mary Hays [id number] Date: 01/29/2024 Age:32 hours  Reason for consult: Follow-up assessment;Breastfeeding assistance;Term;MD order  P4, [redacted]w[redacted]d, 3.3% weight loss  Returned to room to see if baby was ready to breastfeeding. Mother was receptive to help. Baby was sleeping while swaddled on mother's chest. With mother's consent, baby was placed in the crib, swaddle wrap removed, and baby woke up calmly.   Parents were asking questions regarding frequency of feeding, how to know if baby gets anything from the breast, and shared that this was the first baby mother has breast fed. She reports she tried with her first for a few times after he was born. She did experience milk coming in after her births.   Basic breastfeeding education with baby placed in football hold. Mother was able to independent latch baby and he latched with ease. "[name]" was rhythmically sucking with occasional swallows when LC left the room.   Mother was taught hand expression and compression but pulled with discomfort. She had facial grimacing when baby was feeding due to uterine cramping. (P4) Nurse was discussing pain med options for mother for home.  Mother does want to go home today and denied need to stay for breastfeeding assistance. Due to mother's concern of infant's intake, advised to formula feed 20-30 ml or pump and give EBM following breastfeeding until her milk comes to volume. Reviewed signs that baby was getting enough by intake and output. Mother has a manual pump for home use and has used in the hospital.   Mom made aware of O/P services, breastfeeding support groups, community resources, and our phone # for post-discharge questions.     Maternal Data Has patient been taught Hand Expression?: Yes Does the patient have breastfeeding experience prior to this delivery?: No (did not breast feed her other 3 children)       LATCH Score Latch: Grasps breast easily, tongue down, lips flanged, rhythmical sucking.  Audible Swallowing: Spontaneous and intermittent  Type of Nipple: Everted at rest and after stimulation  Comfort (Breast/Nipple): Soft / non-tender  Hold (Positioning): Assistance needed to correctly position infant at breast and maintain latch.  LATCH Score: 9   Lactation Tools Discussed/Used  Hand pump, resource information for breastfeeding support  Interventions Interventions: Breast feeding basics reviewed;Assisted with latch;Hand express;Breast compression;Support pillows;Position options;Education;CDC milk storage guidelines;CDC Guidelines for Breast Pump Cleaning;LC Services brochure  Discharge Discharge Education: Engorgement and breast care;Warning signs for feeding baby;Outpatient recommendation Pump: Manual  Consult Status Consult Status: Complete Date: 01/29/24    [name] 01/29/2024, 6:33 PM

## 2024-01-29 NOTE — Progress Notes (Signed)
 POSTPARTUM PROGRESS NOTE  Post Partum Day 1  Subjective:  Mary Hays is a 33 y.o. [id number] s/p SVD at [redacted]w[redacted]d.  She reports she is doing well. No acute events overnight. She denies any problems with ambulating, voiding or po intake. Strongly desires discharge this evening. Denies nausea or vomiting.  Pain is well controlled.  Lochia is appropriate.  Objective: Blood pressure (!) 100/58, pulse 74, temperature 98.1 F (36.7 C), temperature source Oral, resp. rate 16, height 5\' 1"  (1.549 m), weight 71.9 kg, last menstrual period 04/29/2023, SpO2 98%, unknown if currently breastfeeding.  Physical Exam:  General: alert, cooperative and no distress Chest: no respiratory distress Heart:regular rate, distal pulses intact Uterine Fundus: firm, appropriately tender DVT Evaluation: No calf swelling or tenderness Extremities: trace edema Skin: warm, dry  Recent Labs    01/28/24 0930 01/29/24 0558  HGB 11.2* 10.4*  HCT 36.3 33.0*    Assessment/Plan: Mary Hays is a 33 y.o. [id number] s/p SVD at [redacted]w[redacted]d   PPD#1 - Doing well  Routine postpartum care ABLA - expected post partum loss, asymptomatic  Start oral iron supplementation  Contraception: out patient IUD Feeding: Breast Dispo: Plan for discharge tomorrow.   LOS: 1 day   Darrow End, MD OB Fellow  01/29/2024, 9:42 AM

## 2024-01-29 NOTE — Lactation Note (Signed)
 This note was copied from a baby's chart. Lactation Consultation Note  Patient Name: Mary Hays [id number] Date: 01/29/2024 Age:32 hours  MD has requested Lactation Consultant to assess infant breastfeeding before 24 hr discharge due to low latch scores. Mother was receptive and welcoming to [facility] visit. Mother reports she just attempted to latch baby but he was still sleepy after circumcision.   Mother will call for [facility] with next breastfeeding. [facility] name written on white board.   Mother was provided information regarding [facility] lactation services following D/C, safe handling and storage of breast milk and cleaning pump parts.     [name] 01/29/2024, 3:55 PM

## 2024-01-30 ENCOUNTER — Other Ambulatory Visit (HOSPITAL_COMMUNITY): Payer: Self-pay

## 2024-02-05 ENCOUNTER — Encounter (HOSPITAL_BASED_OUTPATIENT_CLINIC_OR_DEPARTMENT_OTHER): Payer: Medicaid Other | Admitting: Obstetrics & Gynecology

## 2024-02-06 ENCOUNTER — Telehealth (HOSPITAL_COMMUNITY): Payer: Self-pay | Admitting: *Deleted

## 2024-02-06 NOTE — Telephone Encounter (Signed)
 02/06/2024  Name: Mary Hays MRN: [id number] DOB: 01-22-1991  Reason for Call:  Transition of Care Hospital Discharge Call  Contact Status: Patient Contact Status: Complete  Language assistant needed:          Follow-Up Questions: Do You Have Any Concerns About Your Health As You Heal From Delivery?: No Do You Have Any Concerns About Your Infants Health?: No  Edinburgh Postnatal Depression Scale:  In the Past 7 Days: I have been able to laugh and see the funny side of things.: As much as I always could I have looked forward with enjoyment to things.: As much as I ever did I have blamed myself unnecessarily when things went wrong.: No, never I have been anxious or worried for no good reason.: No, not at all I have felt scared or panicky for no good reason.: No, not at all Things have been getting on top of me.: No, I have been coping as well as ever I have been so unhappy that I have had difficulty sleeping.: Not at all I have felt sad or miserable.: No, not at all I have been so unhappy that I have been crying.: No, never The thought of harming myself has occurred to me.: Never Dimple Francis Postnatal Depression Scale Total: 0  PHQ2-9 Depression Scale:     Discharge Follow-up: Edinburgh score requires follow up?: No Patient was advised of the following resources:: Breastfeeding Support Group, Support Group  Post-discharge interventions: Reviewed Newborn Safe Sleep Practices  Signature Julien Odor, RN, 02/06/24, [phone]

## 2024-03-05 ENCOUNTER — Ambulatory Visit (HOSPITAL_BASED_OUTPATIENT_CLINIC_OR_DEPARTMENT_OTHER): Payer: Self-pay | Admitting: Certified Nurse Midwife

## 2024-03-17 ENCOUNTER — Encounter (HOSPITAL_BASED_OUTPATIENT_CLINIC_OR_DEPARTMENT_OTHER): Payer: Self-pay | Admitting: Certified Nurse Midwife

## 2024-03-17 ENCOUNTER — Ambulatory Visit (HOSPITAL_BASED_OUTPATIENT_CLINIC_OR_DEPARTMENT_OTHER): Admitting: Certified Nurse Midwife

## 2024-03-17 ENCOUNTER — Other Ambulatory Visit (HOSPITAL_BASED_OUTPATIENT_CLINIC_OR_DEPARTMENT_OTHER)

## 2024-03-17 VITALS — BP 118/76 | Ht 61.0 in | Wt 142.6 lb

## 2024-03-17 DIAGNOSIS — O24419 Gestational diabetes mellitus in pregnancy, unspecified control: Secondary | ICD-10-CM

## 2024-03-17 NOTE — Progress Notes (Signed)
  Subjective:     Mary Hays is a 33 y.o. female who presents for a postpartum visit. She is 6 weeks postpartum following a spontaneous vaginal delivery. I have fully reviewed the prenatal and intrapartum course. The delivery was at term gestational weeks. Outcome: spontaneous vaginal delivery. Anesthesia: none. Postpartum course has been uneventful. Baby's course has been uneventful. Baby is feeding by breast. Bleeding no bleeding. Bowel function is normal. Bladder function is normal. Patient will be  sexually active. Contraception method is none. Postpartum depression screening: negative.  The following portions of the patient's history were reviewed and updated as appropriate: allergies, current medications, past family history, past medical history, past social history, past surgical history, and problem list.  Review of Systems Pertinent items are noted in HPI.   Objective:    BP 118/76   Ht 5' 1 (1.549 m) Comment: Reported  Wt 142 lb 9.6 oz (64.7 kg)   LMP 04/29/2023   Breastfeeding Yes   BMI 26.94 kg/m   General:  alert, cooperative, and appears stated age   Breasts:  lactating  Lungs:   Heart:    Abdomen: soft, non-tender; bowel sounds normal; no masses,  no organomegaly   Vulva:  normal  Vagina: normal vagina, no discharge, exudate, lesion, or erythema  Cervix:  no lesions  Corpus: normal size, contour, position, consistency, mobility, non-tender  Adnexa:  no mass, fullness, tenderness  Rectal Exam: normal sphincter        Assessment:    [id number] Term SVD here for routine  postpartum exam. Pap smear not due at today's visit.  Hx GDM  Plan:   Pt completing postpartum 2hr GTT today.   1. Contraception: condoms 2. Continue PNV 3. Follow up in: 1 year  or as needed.   Arland MARLA Roller

## 2024-03-18 ENCOUNTER — Ambulatory Visit (HOSPITAL_BASED_OUTPATIENT_CLINIC_OR_DEPARTMENT_OTHER): Payer: Self-pay | Admitting: Certified Nurse Midwife

## 2024-03-18 LAB — GLUCOSE TOLERANCE, 2 HOURS
Glucose, 2 hour: 102 mg/dL (ref 70–139)
Glucose, GTT - Fasting: 89 mg/dL (ref 70–99)

## 2024-06-22 ENCOUNTER — Encounter (HOSPITAL_BASED_OUTPATIENT_CLINIC_OR_DEPARTMENT_OTHER): Payer: Self-pay | Admitting: Obstetrics & Gynecology
